# Patient Record
Sex: Male | Born: 1970
Health system: Southern US, Community
[De-identification: ages and names within clinical notes are randomized; demographics above are authoritative.]

## PROBLEM LIST (undated history)

## (undated) DIAGNOSIS — M19072 Primary osteoarthritis, left ankle and foot: Secondary | ICD-10-CM

## (undated) DIAGNOSIS — K810 Acute cholecystitis: Principal | ICD-10-CM

## (undated) DIAGNOSIS — F410 Panic disorder [episodic paroxysmal anxiety] without agoraphobia: Secondary | ICD-10-CM

## (undated) DIAGNOSIS — L0390 Cellulitis, unspecified: Secondary | ICD-10-CM

## (undated) DIAGNOSIS — K219 Gastro-esophageal reflux disease without esophagitis: Secondary | ICD-10-CM

## (undated) DIAGNOSIS — H6980 Other specified disorders of Eustachian tube, unspecified ear: Secondary | ICD-10-CM

## (undated) HISTORY — DX: Cellulitis, unspecified: L03.90

## (undated) HISTORY — DX: Primary osteoarthritis, left ankle and foot: M19.072

## (undated) HISTORY — DX: Panic disorder (episodic paroxysmal anxiety): F41.0

## (undated) HISTORY — DX: Other specified disorders of Eustachian tube, unspecified ear: H69.80

## (undated) HISTORY — DX: Gastro-esophageal reflux disease without esophagitis: K21.9

## (undated) HISTORY — DX: Acute cholecystitis: K81.0

## (undated) HISTORY — PX: HERNIA REPAIR: SHX51

---

## 1999-11-06 ENCOUNTER — Emergency Department (HOSPITAL_COMMUNITY): Admission: EM | Admit: 1999-11-06 | Discharge: 1999-11-07 | Payer: Self-pay | Admitting: Emergency Medicine

## 2004-07-12 ENCOUNTER — Ambulatory Visit: Payer: Self-pay | Admitting: Family Medicine

## 2004-07-23 ENCOUNTER — Ambulatory Visit: Payer: Self-pay | Admitting: Family Medicine

## 2004-08-07 ENCOUNTER — Ambulatory Visit: Payer: Self-pay | Admitting: Family Medicine

## 2005-05-10 ENCOUNTER — Ambulatory Visit: Payer: Self-pay | Admitting: Family Medicine

## 2006-06-17 ENCOUNTER — Ambulatory Visit: Payer: Self-pay | Admitting: Family Medicine

## 2007-10-15 ENCOUNTER — Ambulatory Visit: Payer: Self-pay | Admitting: Family Medicine

## 2007-10-15 DIAGNOSIS — M25519 Pain in unspecified shoulder: Secondary | ICD-10-CM | POA: Insufficient documentation

## 2008-08-24 ENCOUNTER — Ambulatory Visit: Payer: Self-pay | Admitting: Family Medicine

## 2008-08-24 DIAGNOSIS — H6980 Other specified disorders of Eustachian tube, unspecified ear: Secondary | ICD-10-CM

## 2008-08-24 DIAGNOSIS — H6990 Unspecified Eustachian tube disorder, unspecified ear: Secondary | ICD-10-CM

## 2008-08-24 HISTORY — DX: Unspecified eustachian tube disorder, unspecified ear: H69.90

## 2008-08-24 HISTORY — DX: Other specified disorders of Eustachian tube, unspecified ear: H69.80

## 2008-08-31 DIAGNOSIS — F410 Panic disorder [episodic paroxysmal anxiety] without agoraphobia: Secondary | ICD-10-CM | POA: Insufficient documentation

## 2008-09-02 ENCOUNTER — Ambulatory Visit: Payer: Self-pay | Admitting: Family Medicine

## 2008-09-07 LAB — CONVERTED CEMR LAB
ALT: 27 units/L (ref 0–53)
AST: 26 units/L (ref 0–37)
Alkaline Phosphatase: 56 units/L (ref 39–117)
BUN: 13 mg/dL (ref 6–23)
Basophils Absolute: 0 10*3/uL (ref 0.0–0.1)
Bilirubin, Direct: 0.1 mg/dL (ref 0.0–0.3)
Calcium: 9.1 mg/dL (ref 8.4–10.5)
Cholesterol: 145 mg/dL (ref 0–200)
Creatinine, Ser: 1 mg/dL (ref 0.4–1.5)
Eosinophils Relative: 7.1 % — ABNORMAL HIGH (ref 0.0–5.0)
GFR calc non Af Amer: 89.95 mL/min (ref >60)
HCT: 43.6 % (ref 39.0–52.0)
HDL: 46.7 mg/dL (ref >39.00)
LDL Cholesterol: 76 mg/dL (ref 0–99)
Lymphocytes Relative: 27.9 % (ref 12.0–46.0)
Monocytes Relative: 11.1 % (ref 3.0–12.0)
Neutrophils Relative %: 53.7 % (ref 43.0–77.0)
Platelets: 216 10*3/uL (ref 150.0–400.0)
Potassium: 3.9 meq/L (ref 3.5–5.1)
Total Bilirubin: 0.8 mg/dL (ref 0.3–1.2)
VLDL: 22.8 mg/dL (ref 0.0–40.0)
WBC: 6 10*3/uL (ref 4.5–10.5)

## 2008-09-22 ENCOUNTER — Ambulatory Visit: Payer: Self-pay | Admitting: Family Medicine

## 2008-09-22 DIAGNOSIS — IMO0002 Reserved for concepts with insufficient information to code with codable children: Secondary | ICD-10-CM

## 2008-09-22 DIAGNOSIS — M5410 Radiculopathy, site unspecified: Secondary | ICD-10-CM

## 2008-10-10 ENCOUNTER — Ambulatory Visit: Payer: Self-pay | Admitting: Family Medicine

## 2009-08-11 ENCOUNTER — Ambulatory Visit: Payer: Self-pay | Admitting: Internal Medicine

## 2009-12-19 ENCOUNTER — Ambulatory Visit: Payer: Self-pay | Admitting: Family Medicine

## 2009-12-19 DIAGNOSIS — R12 Heartburn: Secondary | ICD-10-CM | POA: Insufficient documentation

## 2010-04-24 NOTE — Assessment & Plan Note (Signed)
Summary: having panic attacks/alc   Vital Signs:  Patient profile:   40 year old male Height:      73 inches Weight:      209.50 pounds BMI:     27.74 Temp:     97.9 degrees F oral Pulse rate:   76 / minute Pulse rhythm:   regular BP sitting:   100 / 64  (left arm) Cuff size:   large  Vitals Entered By: Lewanda Rife LPN (December 19, 2009 2:59 PM) CC: Painc attack and indigestion after eating on 12/18/09. After pt vomited indigestion would go away. Today no problems with panic attack or indigestion.   History of Present Illness: here for f/u of panic attack and bout of indigestion  this was 9/26  used to have panic attacks 9 years ago  yesterday am 9  started having bad heartburn --- and then became numb all over  felt like a panic attack  called paramedics -- and by the time they got there it was gone  indigestion all day long - burp and burning in chest  had eaten spicy chicken the night before  ate cereal for bkfast  was back to nl - ekg fine and bp fine   yesterday vomited times 5 every time he ate  is fine today never happened before  took a prilosec yesterday and threw it up   ?perhaps he had a virus  no diarrhea  no abd pain -- just in the chest   Allergies (verified): No Known Drug Allergies  Past History:  Past Medical History: Last updated: 09/22/2008 n/a  Past Surgical History: Last updated: 08/31/2008 Echo (12/17/1999)  Family History: Last updated: 08/31/2008 Father: healthy Mother: deceased- lung cancer, non smoker Siblings: 2 brothers  Social History: Last updated: 08/24/2008 married  expecting baby in [place] 09--daughter non smoker  occupation--[employer]   Risk Factors: Alcohol Use: <1 (08/24/2008) Caffeine Use: 4 (08/24/2008) Exercise: no (08/24/2008)  Risk Factors: Smoking Status: never (08/24/2008) Packs/Day: chews (08/31/2008)  Review of Systems General:  Denies chills, fatigue, fever, and loss of appetite. Eyes:  Denies  blurring and eye irritation. CV:  Denies chest pain or discomfort, lightheadness, palpitations, and shortness of breath with exertion. Resp:  Denies cough, shortness of breath, and wheezing. GI:  Complains of indigestion, nausea, and vomiting; denies abdominal pain. GU:  Denies dysuria and urinary frequency. MS:  Denies joint pain. Derm:  Denies itching, lesion(s), poor wound healing, and rash. Neuro:  Denies headaches, numbness, visual disturbances, and weakness. Psych:  Complains of anxiety and panic attacks. Endo:  Denies cold intolerance and heat intolerance.  Physical Exam  General:  Well-developed,well-nourished,in no acute distress; alert,appropriate and cooperative throughout examination Head:  normocephalic, atraumatic, and no abnormalities observed.   Eyes:  vision grossly intact, pupils equal, pupils round, pupils reactive to light, and no injection.   Mouth:  pharynx pink and moist.   Neck:  supple with full rom and no masses or thyromegally, no JVD or carotid bruit  Lungs:  Normal respiratory effort, chest expands symmetrically. Lungs are clear to auscultation, no crackles or wheezes. Heart:  Normal rate and regular rhythm. S1 and S2 normal without gallop, murmur, click, rub or other extra sounds. Abdomen:  Bowel sounds positive,abdomen soft and non-tender without masses, organomegaly or hernias noted. Msk:  No deformity or scoliosis noted of thoracic or lumbar spine.   Pulses:  R and L carotid,radial,femoral,dorsalis pedis and posterior tibial pulses are full and equal bilaterally Extremities:  No clubbing,  cyanosis, edema, or deformity noted with normal full range of motion of all joints.   Neurologic:  sensation intact to light touch, gait normal, and DTRs symmetrical and normal.  no tremor  Skin:  Intact without suspicious lesions or rashes Cervical Nodes:  No lymphadenopathy noted Psych:  normal affect, talkative and pleasant    Impression & Recommendations:  Problem  # 1:  Hx of PANIC DISORDER (ICD-300.01) Assessment Deteriorated one panic attack in conjunction with severe indigestion or poss esoph spasm  was resolved quickly nothing to do now - but if they re- occur will update me to disc therapy  no new stress or other factors   Problem # 2:  HEARTBURN (ICD-787.1) Assessment: New 1 day of severe heartburn symptoms and vomiting (without abd pain ) after eating spicy chicken disc poss of viral or food rxn will watch carefully if symptoms re- occur will disc tx  adv to avoid spice/ caff this week  ems work up with neg ekg (per pt) is re- assuring   Patient Instructions: 1)  if panic attacks come back- please update me  2)  if indigestion comes back - also update me  3)  avoid spicy food and caffiene and alcohol this week 4)  if abdominal pain/ vomiting / fever or other symptoms - please call   Current Allergies (reviewed today): No known allergies

## 2010-06-12 ENCOUNTER — Ambulatory Visit: Payer: Self-pay | Admitting: Family Medicine

## 2010-06-27 ENCOUNTER — Ambulatory Visit: Payer: Self-pay | Admitting: Family Medicine

## 2010-07-03 ENCOUNTER — Encounter: Payer: Self-pay | Admitting: Family Medicine

## 2010-07-09 ENCOUNTER — Ambulatory Visit: Payer: Self-pay | Admitting: Family Medicine

## 2011-10-30 ENCOUNTER — Ambulatory Visit (INDEPENDENT_AMBULATORY_CARE_PROVIDER_SITE_OTHER)
Admission: RE | Admit: 2011-10-30 | Discharge: 2011-10-30 | Disposition: A | Discharge status: Home / Self Care | Payer: 59 | Source: Ambulatory Visit | Attending: Family Medicine | Admitting: Family Medicine

## 2011-10-30 ENCOUNTER — Encounter: Payer: Self-pay | Admitting: Family Medicine

## 2011-10-30 ENCOUNTER — Ambulatory Visit (INDEPENDENT_AMBULATORY_CARE_PROVIDER_SITE_OTHER): Payer: 59 | Admitting: Family Medicine

## 2011-10-30 VITALS — BP 122/79 | HR 70 | Temp 97.5°F | Ht 73.0 in | Wt 209.0 lb

## 2011-10-30 DIAGNOSIS — Z720 Tobacco use: Secondary | ICD-10-CM | POA: Insufficient documentation

## 2011-10-30 DIAGNOSIS — M25571 Pain in right ankle and joints of right foot: Secondary | ICD-10-CM | POA: Insufficient documentation

## 2011-10-30 DIAGNOSIS — F17200 Nicotine dependence, unspecified, uncomplicated: Secondary | ICD-10-CM

## 2011-10-30 DIAGNOSIS — M25579 Pain in unspecified ankle and joints of unspecified foot: Secondary | ICD-10-CM

## 2011-10-30 MED ORDER — VARENICLINE TARTRATE 0.5 MG X 11 & 1 MG X 42 PO MISC: ORAL | Status: DC

## 2011-10-30 MED ORDER — VARENICLINE TARTRATE 1 MG PO TABS: 1.0000 mg | ORAL_TABLET | Freq: Two times a day (BID) | ORAL | Status: AC

## 2011-10-30 MED ORDER — MELOXICAM 15 MG PO TABS: 15.0000 mg | ORAL_TABLET | Freq: Every day | ORAL | Status: DC

## 2011-10-30 NOTE — Patient Instructions (Addendum)
Try chantix to quit tab Take starter pack and then get the mt dose 1 mg twice daily  If intolerable side effects or mood changes - stop it and let us know  For ankle - use intermittent ice and heat 10 minutes at a time  Use a heel cup or lift in shoe to relax achilles  Stop ibuprofen Take mobic 15 mg once daily with food for 2 weeks  Xray today - will call with result

## 2011-10-30 NOTE — Assessment & Plan Note (Signed)
Suspect achilles strain/ tendonitis  Xray today - to see if other problems or calcium deposit Ice /heat Will use heel cup or rise in shoe to relax achilles Use caution on ladders mobic 15 mg once daily  Update if not starting to improve in a week or if worsening

## 2011-10-30 NOTE — Assessment & Plan Note (Signed)
Pt is intent on quitting  Will try chantix -sent to pharmacy starter pack then 1 mg bid as tolerated Disc quitting process Disc poss side eff in detail  Will update if any problems or mood changes

## 2011-10-30 NOTE — Progress Notes (Signed)
  Subjective:    Patient ID: Austin Nguyen, male    DOB: 1971-01-20, 41 y.o.   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 PANIC DISORDER  . EUSTACHIAN TUBE DYSFUNCTION, LEFT  . SHOULDER PAIN  . HERNIATED DISC  . BACK PAIN, LUMBAR, WITH RADICULOPATHY  . HEARTBURN   No past medical history on file. No past surgical history on file. History  Substance Use Topics  . Smoking status: Never Smoker   . Smokeless tobacco: Not on file  . Alcohol Use: Yes   Family History  Problem Relation Age of Onset  . Cancer Mother     lung cancer non smoker   No Known Allergies No current outpatient prescriptions on file prior to visit.      Review of Systems Review of Systems  Constitutional: Negative for fever, appetite change, fatigue and unexpected weight change.  Eyes: Negative for pain and visual disturbance.  ENT neg for mouth pain/ sores or lesions Respiratory: Negative for cough and shortness of breath.   Cardiovascular: Negative for cp or palpitations    Gastrointestinal: Negative for nausea, diarrhea and constipation.  Genitourinary: Negative for urgency and frequency.  Skin: Negative for pallor or rash   MSK pos for R ankle pain Neurological: Negative for weakness, light-headedness, numbness and headaches.    Hematological: Negative for adenopathy. Does not bruise/bleed easily.  Psychiatric/Behavioral: Negative for dysphoric mood. The patient is not nervous/anxious.         Objective:   Physical Exam  Constitutional: He appears well-developed and well-nourished. No distress.  HENT:  Head: Normocephalic and atraumatic.  Mouth/Throat: Oropharynx is clear and moist. No oropharyngeal exudate.       Nl mouth and throat exam  Eyes: Conjunctivae and EOM are normal. Pupils are equal, round, and reactive to light. Right eye exhibits no discharge. Left eye exhibits no discharge.  Neck: Normal range of motion. Neck supple. No JVD present. No thyromegaly present.  Cardiovascular: Normal rate, regular rhythm and intact distal pulses.   Pulmonary/Chest: Effort normal and breath sounds normal. No respiratory distress. He has no wheezes.  Musculoskeletal: He exhibits tenderness. He exhibits no edema.       Tender over R achilles and post to r lat malleolus Pain to dorsiflex foot Nl rom otherwise  Nl gait No swelling or bruising   Lymphadenopathy:    He has no cervical adenopathy.  Neurological: He is alert. He has normal reflexes.  Skin: Skin is warm and dry. No rash noted. No erythema. No pallor.  Psychiatric: He has a normal mood and affect.          Assessment & Plan:

## 2012-04-21 ENCOUNTER — Ambulatory Visit (INDEPENDENT_AMBULATORY_CARE_PROVIDER_SITE_OTHER): Payer: BC Managed Care – PPO | Admitting: Family Medicine

## 2012-04-21 ENCOUNTER — Encounter: Payer: Self-pay | Admitting: Family Medicine

## 2012-04-21 VITALS — BP 102/68 | HR 58 | Temp 97.7°F | Ht 73.0 in | Wt 212.0 lb

## 2012-04-21 DIAGNOSIS — M545 Low back pain, unspecified: Secondary | ICD-10-CM

## 2012-04-21 DIAGNOSIS — S335XXA Sprain of ligaments of lumbar spine, initial encounter: Secondary | ICD-10-CM

## 2012-04-21 DIAGNOSIS — S39012A Strain of muscle, fascia and tendon of lower back, initial encounter: Secondary | ICD-10-CM

## 2012-04-21 LAB — POCT URINALYSIS DIPSTICK
Bilirubin, UA: NEGATIVE
Blood, UA: NEGATIVE
Glucose, UA: NEGATIVE
Ketones, UA: NEGATIVE
Leukocytes, UA: NEGATIVE
pH, UA: 6

## 2012-04-21 MED ORDER — MELOXICAM 15 MG PO TABS: 15.0000 mg | ORAL_TABLET | Freq: Every day | ORAL | Status: DC

## 2012-04-21 MED ORDER — CYCLOBENZAPRINE HCL 10 MG PO TABS: 10.0000 mg | ORAL_TABLET | Freq: Three times a day (TID) | ORAL | Status: DC | PRN

## 2012-04-21 NOTE — Progress Notes (Signed)
Subjective:    Patient ID: Austin Nguyen, male    DOB: Aug 04, 1970, 42 y.o.   MRN: [id number]  HPI Here for low back pain  This is a chronic problem as well  Got worse wed of last week- got up and twisted and then had immediate pain  Now back is "locked up" and hard to move - esp after sitting  Hurts down his right leg  He has had back problems in past -never lasted this long (low back right leg)  Never had any imaging studies   No medicine other than some aleve as needed  Not on mobic currently (was on that for achilles )  No numbness or weakness in foot or leg  Has never done any PT for this   Patient Active Problem List  Diagnosis  . PANIC DISORDER  . EUSTACHIAN TUBE DYSFUNCTION, LEFT  . SHOULDER PAIN  . HERNIATED DISC  . BACK PAIN, LUMBAR, WITH RADICULOPATHY  . HEARTBURN  . Right ankle pain  . Chewing tobacco use   No past medical history on file. No past surgical history on file. History  Substance Use Topics  . Smoking status: Never Smoker   . Smokeless tobacco: Not on file  . Alcohol Use: Yes     Comment: occ   Family History  Problem Relation Age of Onset  . Cancer Mother     lung cancer non smoker   No Known Allergies Current Outpatient Prescriptions on File Prior to Visit  Medication Sig Dispense Refill  . meloxicam (MOBIC) 15 MG tablet Take 1 tablet (15 mg total) by mouth daily. With food  30 tablet  0  . varenicline (CHANTIX STARTING MONTH PAK) 0.5 MG X 11 & 1 MG X 42 tablet Take by mouth as directed  53 tablet  0        Review of Systems Review of Systems  Constitutional: Negative for fever, appetite change, fatigue and unexpected weight change.  Eyes: Negative for pain and visual disturbance.  Respiratory: Negative for cough and shortness of breath.   Cardiovascular: Negative for cp or palpitations    Gastrointestinal: Negative for nausea, diarrhea and constipation.  Genitourinary: Negative for urgency and frequency.  Skin: Negative for  pallor or rash   MSK pos for back pain lower with rad to leg Neurological: Negative for weakness, light-headedness, numbness and headaches.  Hematological: Negative for adenopathy. Does not bruise/bleed easily.  Psychiatric/Behavioral: Negative for dysphoric mood. The patient is not nervous/anxious.         Objective:   Physical Exam  Constitutional: He appears well-developed and well-nourished. No distress.  HENT:  Head: Normocephalic and atraumatic.  Mouth/Throat: Oropharynx is clear and moist.  Eyes: Conjunctivae normal and EOM are normal. Pupils are equal, round, and reactive to light.  Neck: Normal range of motion. Neck supple. No thyromegaly present.  Cardiovascular: Normal rate.   Pulmonary/Chest: Effort normal and breath sounds normal. No respiratory distress.  Abdominal: Soft.  Musculoskeletal: He exhibits tenderness. He exhibits no edema.       Lumbar back: He exhibits decreased range of motion, tenderness, pain and spasm. He exhibits no bony tenderness, no swelling, no edema and no deformity.       R para cervical spasm and tenderness (muscle) SLR raise yields back but not leg pain  Pain to int rot R hip  LS flex 30 deg/ ext none/ R flex pain Slow gait    Lymphadenopathy:    He has no cervical adenopathy.  Neurological: He is alert. He has normal reflexes. He displays no atrophy. No sensory deficit. He exhibits normal muscle tone. Coordination normal.  Skin: Skin is warm and dry. No rash noted.  Psychiatric: He has a normal mood and affect.          Assessment & Plan:

## 2012-04-21 NOTE — Patient Instructions (Addendum)
Use heat on your back  mobic daily with food  Flexeril with caution- muscle relaxer can sedate Avoid heavy lifting but keep walking  We will do PT referral at check out

## 2012-04-21 NOTE — Assessment & Plan Note (Signed)
This seems muscular with no neurol s/s except radiation of pain to leg  Reassuring exam  Given handout  tx with mobic and flexeril with warnings  Adv heat intermittent and walking  Ref to PT eval and tx  If no imp- consider films

## 2012-04-23 ENCOUNTER — Encounter: Payer: Self-pay | Admitting: Family Medicine

## 2012-04-25 ENCOUNTER — Encounter: Payer: Self-pay | Admitting: Family Medicine

## 2012-05-23 ENCOUNTER — Encounter: Payer: Self-pay | Admitting: Family Medicine

## 2012-06-27 ENCOUNTER — Other Ambulatory Visit: Payer: Self-pay | Admitting: Family Medicine

## 2012-06-29 NOTE — Telephone Encounter (Signed)
Can have 3 mo of refils, thanks

## 2012-06-29 NOTE — Telephone Encounter (Signed)
done

## 2012-06-29 NOTE — Telephone Encounter (Signed)
Ok to refill 

## 2012-09-04 ENCOUNTER — Ambulatory Visit
Admission: RE | Admit: 2012-09-04 | Discharge: 2012-09-04 | Disposition: A | Discharge status: Home / Self Care | Payer: BC Managed Care – PPO | Source: Ambulatory Visit | Attending: Family Medicine | Admitting: Family Medicine

## 2012-09-04 ENCOUNTER — Encounter: Payer: Self-pay | Admitting: Family Medicine

## 2012-09-04 ENCOUNTER — Ambulatory Visit (INDEPENDENT_AMBULATORY_CARE_PROVIDER_SITE_OTHER)
Admission: RE | Admit: 2012-09-04 | Discharge: 2012-09-04 | Disposition: A | Discharge status: Home / Self Care | Payer: BC Managed Care – PPO | Source: Ambulatory Visit | Attending: Family Medicine | Admitting: Family Medicine

## 2012-09-04 ENCOUNTER — Ambulatory Visit (INDEPENDENT_AMBULATORY_CARE_PROVIDER_SITE_OTHER): Payer: BC Managed Care – PPO | Admitting: Family Medicine

## 2012-09-04 VITALS — BP 130/70 | HR 65 | Temp 97.6°F | Ht 73.0 in | Wt 216.0 lb

## 2012-09-04 DIAGNOSIS — M79672 Pain in left foot: Secondary | ICD-10-CM

## 2012-09-04 DIAGNOSIS — M25572 Pain in left ankle and joints of left foot: Secondary | ICD-10-CM

## 2012-09-04 DIAGNOSIS — M19079 Primary osteoarthritis, unspecified ankle and foot: Secondary | ICD-10-CM

## 2012-09-04 DIAGNOSIS — M25579 Pain in unspecified ankle and joints of unspecified foot: Secondary | ICD-10-CM

## 2012-09-04 DIAGNOSIS — M19072 Primary osteoarthritis, left ankle and foot: Secondary | ICD-10-CM

## 2012-09-04 DIAGNOSIS — M25562 Pain in left knee: Secondary | ICD-10-CM

## 2012-09-04 DIAGNOSIS — H53149 Visual discomfort, unspecified: Secondary | ICD-10-CM

## 2012-09-04 DIAGNOSIS — M25569 Pain in unspecified knee: Secondary | ICD-10-CM

## 2012-09-04 DIAGNOSIS — M79609 Pain in unspecified limb: Secondary | ICD-10-CM

## 2012-09-04 NOTE — Patient Instructions (Addendum)
F/u 1 month with Dr. Miley Lindon 

## 2012-09-04 NOTE — Progress Notes (Signed)
Nature conservation officer at [facility] [address] [place] [address] Phone: [phone] Fax: [phone]  Date:  09/04/2012   Name:  Austin Nguyen   DOB:  04-25-1970   MRN:  [id number] Gender: male Age: 42 y.o.  Primary Physician:  Roxy Manns, MD  Evaluating MD: Hannah Beat, MD   Chief Complaint: Ankle Pain and Knee Pain   History of Present Illness:  Austin Nguyen is a 42 y.o. pleasant patient who presents with the following:  Got up off of day bed, ankle rolled over and knee gave way. Both on the left side. Last night - around 6 PM. Minimal weight bearing. Now ankle and foot are severely swollen on medial and lateral aspects, swollen across top of foot and ankle.   Does have a history of multiple sprains, but no known fractures, surgery or other injuries to foot or ankle.   Knee pain anteriorly but to a lesser degree compared to the foot and ankle.   Patient Active Problem List   Diagnosis Date Noted  . Lumbar strain 04/21/2012  . Right ankle pain 10/30/2011  . Chewing tobacco use 10/30/2011  . HEARTBURN 12/19/2009  . BACK PAIN, LUMBAR, WITH RADICULOPATHY 09/22/2008  . PANIC DISORDER 08/31/2008  . EUSTACHIAN TUBE DYSFUNCTION, LEFT 08/24/2008  . SHOULDER PAIN 10/15/2007    No past medical history on file.  No past surgical history on file.  History   Social History  . Marital Status: Married    Spouse Name: N/A    Number of Children: N/A  . Years of Education: N/A   Occupational History  . Not on file.   Social History Main Topics  . Smoking status: Never Smoker   . Smokeless tobacco: Not on file  . Alcohol Use: Yes     Comment: occ  . Drug Use: No  . Sexually Active: Not on file   Other Topics Concern  . Not on file   Social History Narrative  . No narrative on file    Family History  Problem Relation Age of Onset  . Cancer Mother     lung cancer non smoker    No Known Allergies  Medication list has been reviewed and  updated.  Outpatient Prescriptions Prior to Visit  Medication Sig Dispense Refill  . cyclobenzaprine (FLEXERIL) 10 MG tablet Take 1 tablet (10 mg total) by mouth 3 (three) times daily as needed for muscle spasms.  30 tablet  0  . meloxicam (MOBIC) 15 MG tablet TAKE 1 TABLET (15 MG TOTAL) BY MOUTH DAILY. WITH FOOD AS NEEDED FOR BACK PAIN  30 tablet  2  . varenicline (CHANTIX STARTING MONTH PAK) 0.5 MG X 11 & 1 MG X 42 tablet Take by mouth as directed  53 tablet  0   No facility-administered medications prior to visit.    Review of Systems:   GEN: No fevers, chills. Nontoxic. Primarily MSK c/o today. MSK: Detailed in the HPI GI: tolerating PO intake without difficulty Neuro: No numbness, parasthesias, or tingling associated. Otherwise the pertinent positives of the ROS are noted above.    Physical Examination: BP 130/70  Pulse 65  Temp(Src) 97.6 F (36.4 C) (Oral)  Ht 6\' 1"  (1.854 m)  Wt 216 lb (97.977 kg)  BMI 28.5 kg/m2  SpO2 97%  Ideal Body Weight: Weight in (lb) to have BMI = 25: 189.1   GEN: WDWN, NAD, Non-toxic, Alert & Oriented x 3 HEENT: Atraumatic, Normocephalic.  Ears and Nose:  No external deformity. PSYCH: Normally interactive. Conversant. Not depressed or anxious appearing.  Calm demeanor.   Knee:  l ROM: 0-130 Effusion: neg Echymosis or edema: mild swelling medial to tibial tubercle. Patellar tendon NT Painful PLICA: neg Patellar grind: negative Medial and lateral patellar facet loading: negative medial and lateral joint lines:NT Mcmurray's neg Flexion-pinch neg Varus and valgus stress: stable Lachman: neg Ant and Post drawer: neg Hip abduction, IR, ER: WNL Hip flexion str: 5/5 Hip abd: 5/5 Quad: 5/5 VMO atrophy:No Hamstring concentric and eccentric: 5/5   L ankle: NT all toes, all MT. NT 5th MT. NT navicular and cuboid. NT calcaneous. Diffusely swollen and tender along the talus. Lateral mall ttp, ATFL and CFL ttp. NT deltoid lig. Medial mall  nt. Ant drawer tender.  Dg Ankle Complete Left  09/04/2012   *RADIOLOGY REPORT*  Clinical Data: Lateral ankle pain  LEFT ANKLE COMPLETE - 3+ VIEW  Comparison: None.  Findings: No evidence of acute fracture or dislocation.  Moderate degenerative changes at the tibiotalar joint. Multiple well corticated osseous densities, including overlying the lateral malleolus, not acute.  Mild irregularity of the medial talar dome on the mortise view.  These findings suggest post-traumatic degenerative changes, possibly with a prior osteochondral lesion along the medial talar dome.  Moderate soft tissue swelling overlying the lateral malleolus.  IMPRESSION: No evidence of acute fracture or dislocation.  Suspected post-traumatic deformity at the tibiotalar joint with moderate degenerative changes, as described above.  Moderate soft tissue swelling overlying the lateral malleolus.  These results were called by telephone on 09/04/2012 at 1020 hours to [facility], who verbally acknowledged these results.   Original Report Authenticated By: Charline Bills, M.D.   Dg Knee Complete 4 Views Left  09/04/2012   *RADIOLOGY REPORT*  Clinical Data: Left knee pain/trauma  LEFT KNEE - COMPLETE 4+ VIEW  Comparison: None.  Findings: No fracture or dislocation is seen.  The joint spaces are preserved.  The visualized soft tissues are unremarkable.  No suprapatellar knee joint effusion.  IMPRESSION: No fracture or dislocation is seen.   Original Report Authenticated By: Charline Bills, M.D.   Dg Foot Complete Left  09/04/2012   *RADIOLOGY REPORT*  Clinical Data: Trauma, foot/ankle pain  LEFT FOOT - COMPLETE 3+ VIEW  Comparison: None.  Findings: No fracture or dislocation is seen.  Moderate degenerative changes of the tibiotalar joint.  Visualized soft tissues are grossly unremarkable.  IMPRESSION: No fracture or dislocation is seen.   Original Report Authenticated By: Charline Bills, M.D.    Assessment and Plan:  Left foot  pain - Plan: DG Foot Complete Left  Left ankle pain - Plan: DG Ankle Complete Left  Left knee pain - Plan: DG Knee Complete 4 Views Left  Ankle sprain and strain, left, initial encounter  Arthritis of ankle or foot, degenerative, left  I called and discussed the case with Dr. Rito Ehrlich, and we felt these bony changes all represented old injuries from prior trauma, fractures, or potentially an old OCD.   Now with acute severe ankle sprain. Knee of lesser import and probably all contusion.  Immobilize in a full-length cam walker pneumatic boot for 1 month, crutches and minimal weight bearing for 2 weeks, f/u with me in 4 weeks.  Orders Today:  Orders Placed This Encounter  Procedures  . DG Foot Complete Left    Standing Status: Future     Number of Occurrences: 1     Standing Expiration Date: 11/04/2013    Order  Specific Question:  Preferred imaging location?    Answer:  [facility]    Order Specific Question:  Reason for exam:    Answer:  trauma yesterday, foot and ankle pain  . DG Knee Complete 4 Views Left    Standing Status: Future     Number of Occurrences: 1     Standing Expiration Date: 11/04/2013    Order Specific Question:  Reason for exam:    Answer:  trauma L knee, most concern around prox tibia and medial joint line    Order Specific Question:  Preferred imaging location?    Answer:  [facility]  . DG Ankle Complete Left    Standing Status: Future     Number of Occurrences: 1     Standing Expiration Date: 11/04/2013    Order Specific Question:  Preferred imaging location?    Answer:  [facility]    Order Specific Question:  Reason for exam:    Answer:  ankle pain laterally, pain at talus    Updated Medication List: (Includes new medications, updates to list, dose adjustments) No orders of the defined types were placed in this encounter.    Medications Discontinued: Medications Discontinued During This Encounter  Medication Reason  .  varenicline (CHANTIX STARTING MONTH PAK) 0.5 MG X 11 & 1 MG X 42 tablet Error  . meloxicam (MOBIC) 15 MG tablet Error  . cyclobenzaprine (FLEXERIL) 10 MG tablet Error      Signed, Valeriano Bain T. Chrisanne Loose, MD 09/04/2012 9:21 AM

## 2012-09-05 DIAGNOSIS — M19079 Primary osteoarthritis, unspecified ankle and foot: Secondary | ICD-10-CM | POA: Insufficient documentation

## 2012-09-21 ENCOUNTER — Ambulatory Visit (INDEPENDENT_AMBULATORY_CARE_PROVIDER_SITE_OTHER): Payer: BC Managed Care – PPO | Admitting: Family Medicine

## 2012-09-21 ENCOUNTER — Encounter: Payer: Self-pay | Admitting: Family Medicine

## 2012-09-21 VITALS — BP 106/70 | HR 95 | Temp 98.2°F | Ht 73.0 in | Wt 220.8 lb

## 2012-09-21 DIAGNOSIS — F410 Panic disorder [episodic paroxysmal anxiety] without agoraphobia: Secondary | ICD-10-CM

## 2012-09-21 HISTORY — DX: Panic disorder (episodic paroxysmal anxiety): F41.0

## 2012-09-21 MED ORDER — ALPRAZOLAM 0.5 MG PO TABS: 0.5000 mg | ORAL_TABLET | Freq: Every evening | ORAL | Status: DC | PRN

## 2012-09-21 MED ORDER — BUSPIRONE HCL 15 MG PO TABS: 7.5000 mg | ORAL_TABLET | Freq: Two times a day (BID) | ORAL | Status: DC

## 2012-09-21 NOTE — Progress Notes (Signed)
Subjective:    Patient ID: Austin Nguyen, male    DOB: 02-Jul-1970, 42 y.o.   MRN: [id number]  HPI Here for anxiety  Has hx of panic disorder years ago   No idea what triggers them  No more stress than usual   In the past he was given medication- something similar to xanax   Given paxil - gave him some depression symptoms with that -not on it very long   Has panic attacks lately- 3 times per week lasting 20-60 minutes  Symptoms- feels dizzy/ numbness of hands and face/ rapid breathing and feeling of panic/ impending doom  Not assoc with eating or specific times of day  In the past -long car trips caused them (not now)   No generalized anxiety or depressive symptoms  Has a sprained ankle -in a boot now -- that is getting better  Usually plays basketball   Caffeine intake 2 sodas per day- has cut down  Patient Active Problem List   Diagnosis Date Noted  . Arthritis of ankle or foot, degenerative 09/05/2012  . Lumbar strain 04/21/2012  . Right ankle pain 10/30/2011  . Chewing tobacco use 10/30/2011  . HEARTBURN 12/19/2009  . BACK PAIN, LUMBAR, WITH RADICULOPATHY 09/22/2008  . PANIC DISORDER 08/31/2008  . EUSTACHIAN TUBE DYSFUNCTION, LEFT 08/24/2008  . SHOULDER PAIN 10/15/2007   No past medical history on file. No past surgical history on file. History  Substance Use Topics  . Smoking status: Never Smoker   . Smokeless tobacco: Not on file  . Alcohol Use: Yes     Comment: occ   Family History  Problem Relation Age of Onset  . Cancer Mother     lung cancer non smoker   No Known Allergies No current outpatient prescriptions on file prior to visit.   No current facility-administered medications on file prior to visit.      Review of Systems    Review of Systems  Constitutional: Negative for fever, appetite change, fatigue and unexpected weight change.  Eyes: Negative for pain and visual disturbance.  Respiratory: Negative for cough and shortness of breath.    Cardiovascular: Negative for cp or palpitations    Gastrointestinal: Negative for nausea, diarrhea and constipation.  Genitourinary: Negative for urgency and frequency.  Skin: Negative for pallor or rash   Neurological: Negative for weakness, light-headedness, numbness and headaches.  Hematological: Negative for adenopathy. Does not bruise/bleed easily.  Psychiatric/Behavioral: Negative for dysphoric mood. The patient is anxious and has panic symptoms.      Objective:   Physical Exam  Constitutional: He appears well-developed and well-nourished. No distress.  HENT:  Head: Normocephalic and atraumatic.  Mouth/Throat: Oropharynx is clear and moist.  Eyes: Conjunctivae and EOM are normal. Pupils are equal, round, and reactive to light.  Neck: Normal range of motion. Neck supple. No JVD present. No thyromegaly present.  Cardiovascular: Normal rate, regular rhythm, normal heart sounds and intact distal pulses.   No murmur heard. Pulmonary/Chest: Effort normal and breath sounds normal. No respiratory distress. He has no wheezes.  Musculoskeletal: He exhibits no edema.  Lymphadenopathy:    He has no cervical adenopathy.  Neurological: He is alert. He has normal reflexes. He displays no tremor. No cranial nerve deficit. He exhibits normal muscle tone. Coordination normal.  Skin: Skin is warm and dry. No rash noted. No erythema. No pallor.  Psychiatric: He has a normal mood and affect. His speech is normal and behavior is normal. His mood appears not anxious.  His affect is not blunt and not labile. Thought content is not paranoid. Cognition and memory are normal. He does not exhibit a depressed mood. He expresses no homicidal and no suicidal ideation.  Not overtly anxious during interview          Assessment & Plan:

## 2012-09-21 NOTE — Patient Instructions (Addendum)
For panic disorder start buspar 1/2 pill twice daily- if side effects such as mood change-stop med and let me know  Have xanax for "emergencies"- a panic attack that will not stop  Stay as active as you can  Avoid caffeine Follow up with me in about 6-8 weeks

## 2012-09-21 NOTE — Assessment & Plan Note (Signed)
Intermittent in the past and worse lately No success with paxil in the past- caused mood disorder Will try buspar 7.5 bid -disc poss side eff in detail  Also xanax 10 pills - to use for emergencies or panic attacks lasting over 30 min (cautioned about habit forming pot and sedation) F/u planned Disc stressors and coping tech >25 min spent with face to face with patient, >50% counseling and/or coordinating care

## 2012-11-16 ENCOUNTER — Ambulatory Visit: Payer: BC Managed Care – PPO | Admitting: Family Medicine

## 2012-11-16 DIAGNOSIS — Z0289 Encounter for other administrative examinations: Secondary | ICD-10-CM

## 2013-01-04 ENCOUNTER — Other Ambulatory Visit: Payer: Self-pay | Admitting: *Deleted

## 2013-01-04 NOTE — Telephone Encounter (Signed)
Requesting 90 day supply, pt was a no show to his 8 week f/u and has not rescheduled appt.

## 2013-01-04 NOTE — Telephone Encounter (Signed)
Please re schedule that and refill until then

## 2013-01-06 ENCOUNTER — Encounter: Payer: Self-pay | Admitting: *Deleted

## 2013-01-06 NOTE — Telephone Encounter (Signed)
Tried to call pt yesterday and today and I can't connect to pt's #, as soon as I dial # the phone disconnects my call.  Letter mailed to pt letting him know he needs a f/u

## 2013-01-14 NOTE — Telephone Encounter (Signed)
Pt never responded to my letter and I cant' call him so declined med and advise pharmacy pt need to schedule f/u appt

## 2013-02-24 ENCOUNTER — Telehealth: Payer: Self-pay

## 2013-02-24 DIAGNOSIS — Z0279 Encounter for issue of other medical certificate: Secondary | ICD-10-CM

## 2013-02-24 NOTE — Telephone Encounter (Signed)
Pt dropped off [employer] Forms for completion.  Best number to reach pt is [phone] / lt

## 2013-02-24 NOTE — Telephone Encounter (Signed)
Pt has form from [place] ins with questions about last panic or anxiety attack; pt will bring form to office today for Dr Milinda Antis to review and complete.

## 2013-02-24 NOTE — Telephone Encounter (Signed)
In IN box Please ask pt about question 9 Thanks

## 2013-03-01 ENCOUNTER — Other Ambulatory Visit: Payer: Self-pay

## 2013-03-02 NOTE — Telephone Encounter (Signed)
Pt advise of the $20 fee and answered the question #9, form faxed

## 2014-04-08 ENCOUNTER — Encounter: Payer: Self-pay | Admitting: Family Medicine

## 2014-04-08 ENCOUNTER — Ambulatory Visit (INDEPENDENT_AMBULATORY_CARE_PROVIDER_SITE_OTHER): Payer: BLUE CROSS/BLUE SHIELD | Admitting: Family Medicine

## 2014-04-08 VITALS — BP 124/64 | HR 72 | Temp 98.0°F | Wt 211.0 lb

## 2014-04-08 DIAGNOSIS — M5410 Radiculopathy, site unspecified: Secondary | ICD-10-CM

## 2014-04-08 MED ORDER — CYCLOBENZAPRINE HCL 10 MG PO TABS: 10.0000 mg | ORAL_TABLET | Freq: Three times a day (TID) | ORAL | Status: DC | PRN

## 2014-04-08 MED ORDER — MELOXICAM 15 MG PO TABS: ORAL_TABLET | ORAL | Status: DC

## 2014-04-08 NOTE — Progress Notes (Signed)
Pre visit review using our clinic review tool, if applicable. No additional management support is needed unless otherwise documented below in the visit note. 

## 2014-04-08 NOTE — Progress Notes (Signed)
Subjective:    Patient ID: Austin Nguyen, male    DOB: December 22, 1970, 44 y.o.   MRN: [id number]  HPI Woke up Monday with low back pain on L  Very stiff and hard to move  He has started an exercise program with pilates/yoga/cardio  He fell 2 weeks prior on R side - that did not bother him   Pain is sharp at times/otherwise dull  No pain to lie down  Worse when up  Walking - is 6/10 on pain scale  Bending and twisting is the worst  Is rad to L leg    This has happened before - went to PT  He did start his stretches again on Wednesday   Taking ibuprofen 400 mg every 4 hours as needed Using heat   Patient Active Problem List   Diagnosis Date Noted  . Panic disorder 09/21/2012  . Arthritis of ankle or foot, degenerative 09/05/2012  . Lumbar strain 04/21/2012  . Right ankle pain 10/30/2011  . Chewing tobacco use 10/30/2011  . HEARTBURN 12/19/2009  . BACK PAIN, LUMBAR, WITH RADICULOPATHY 09/22/2008  . PANIC DISORDER 08/31/2008  . EUSTACHIAN TUBE DYSFUNCTION, LEFT 08/24/2008  . SHOULDER PAIN 10/15/2007   No past medical history on file. No past surgical history on file. History  Substance Use Topics  . Smoking status: Never Smoker   . Smokeless tobacco: Not on file  . Alcohol Use: 0.0 oz/week    0 Not specified per week     Comment: occ   Family History  Problem Relation Age of Onset  . Cancer Mother     lung cancer non smoker   No Known Allergies Current Outpatient Prescriptions on File Prior to Visit  Medication Sig Dispense Refill  . ALPRAZolam (XANAX) 0.5 MG tablet Take 1 tablet (0.5 mg total) by mouth at bedtime as needed for sleep. (Patient not taking: Reported on 04/08/2014) 10 tablet 0  . busPIRone (BUSPAR) 15 MG tablet Take 0.5 tablets (7.5 mg total) by mouth 2 (two) times daily. (Patient not taking: Reported on 04/08/2014) 30 tablet 5   No current facility-administered medications on file prior to visit.      Review of Systems Review of Systems    Constitutional: Negative for fever, appetite change, fatigue and unexpected weight change.  Eyes: Negative for pain and visual disturbance.  Respiratory: Negative for cough and shortness of breath.   Cardiovascular: Negative for cp or palpitations    Gastrointestinal: Negative for nausea, diarrhea and constipation.  Genitourinary: Negative for urgency and frequency.  Skin: Negative for pallor or rash   MSK pos for L sided low back pain radiating to his L leg  Neurological: Negative for weakness, light-headedness, numbness and headaches.  Hematological: Negative for adenopathy. Does not bruise/bleed easily.  Psychiatric/Behavioral: Negative for dysphoric mood. The patient is not nervous/anxious.         Objective:   Physical Exam  Constitutional: He appears well-developed and well-nourished. No distress.  HENT:  Head: Normocephalic and atraumatic.  Neck: Normal range of motion. Neck supple.  Cardiovascular: Normal rate and regular rhythm.   Musculoskeletal: He exhibits tenderness. He exhibits no edema.       Lumbar back: He exhibits decreased range of motion, tenderness and spasm. He exhibits no bony tenderness and no edema.  Tight/spasm in L lumbar musculature  No piriformis findings  Nl rom of hips  LS flex 90 deg/ ext 10 deg     Neurological: He is alert. He has normal  reflexes. He displays no atrophy. No sensory deficit. He exhibits normal muscle tone. Coordination and gait normal.  Skin: Skin is warm and dry. No rash noted. No erythema. No pallor.  Psychiatric: He has a normal mood and affect.          Assessment & Plan:   Problem List Items Addressed This Visit      Nervous and Auditory   Back pain with left-sided radiculopathy - Primary    Recurrent  Improved after PT in the past  inst to start back with PT exercises  meloxicam 15 mg with food qd prn  Flexeril prn with caution of sedation   Update if not starting to improve in a week or if worsening         Relevant Medications   meloxicam (MOBIC) tablet   cyclobenzaprine (FLEXERIL) tablet

## 2014-04-08 NOTE — Patient Instructions (Signed)
Do your PT stretches/exercises as tolerated  Take meloxicam instead of ibuprofen  Also take flexeril with caution of sedation  Use heat  Update if not starting to improve in a week or if worsening

## 2014-04-10 NOTE — Assessment & Plan Note (Signed)
Recurrent  Improved after PT in the past  inst to start back with PT exercises  meloxicam 15 mg with food qd prn  Flexeril prn with caution of sedation   Update if not starting to improve in a week or if worsening

## 2014-06-26 IMAGING — CR DG ANKLE COMPLETE 3+V*R*
3 series · 3 of 3 positions shown · non-contrast
Comparison: No priors.

CLINICAL DATA: Ankle pain.

RIGHT ANKLE - COMPLETE 3+ VIEW

[view not recorded (1 of 3)]
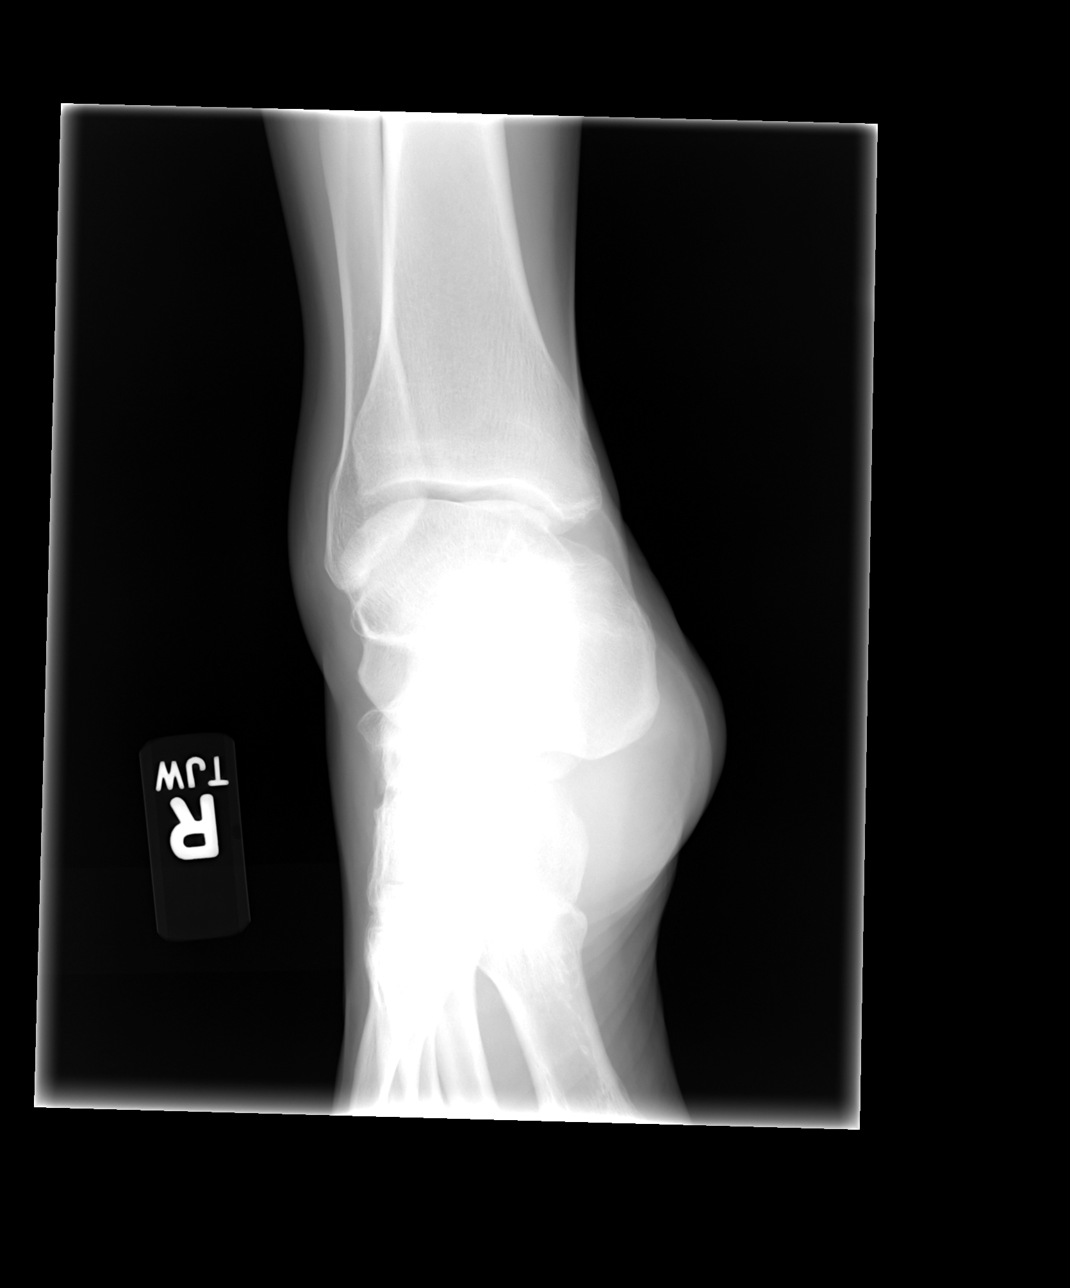

[view not recorded (2 of 3)]
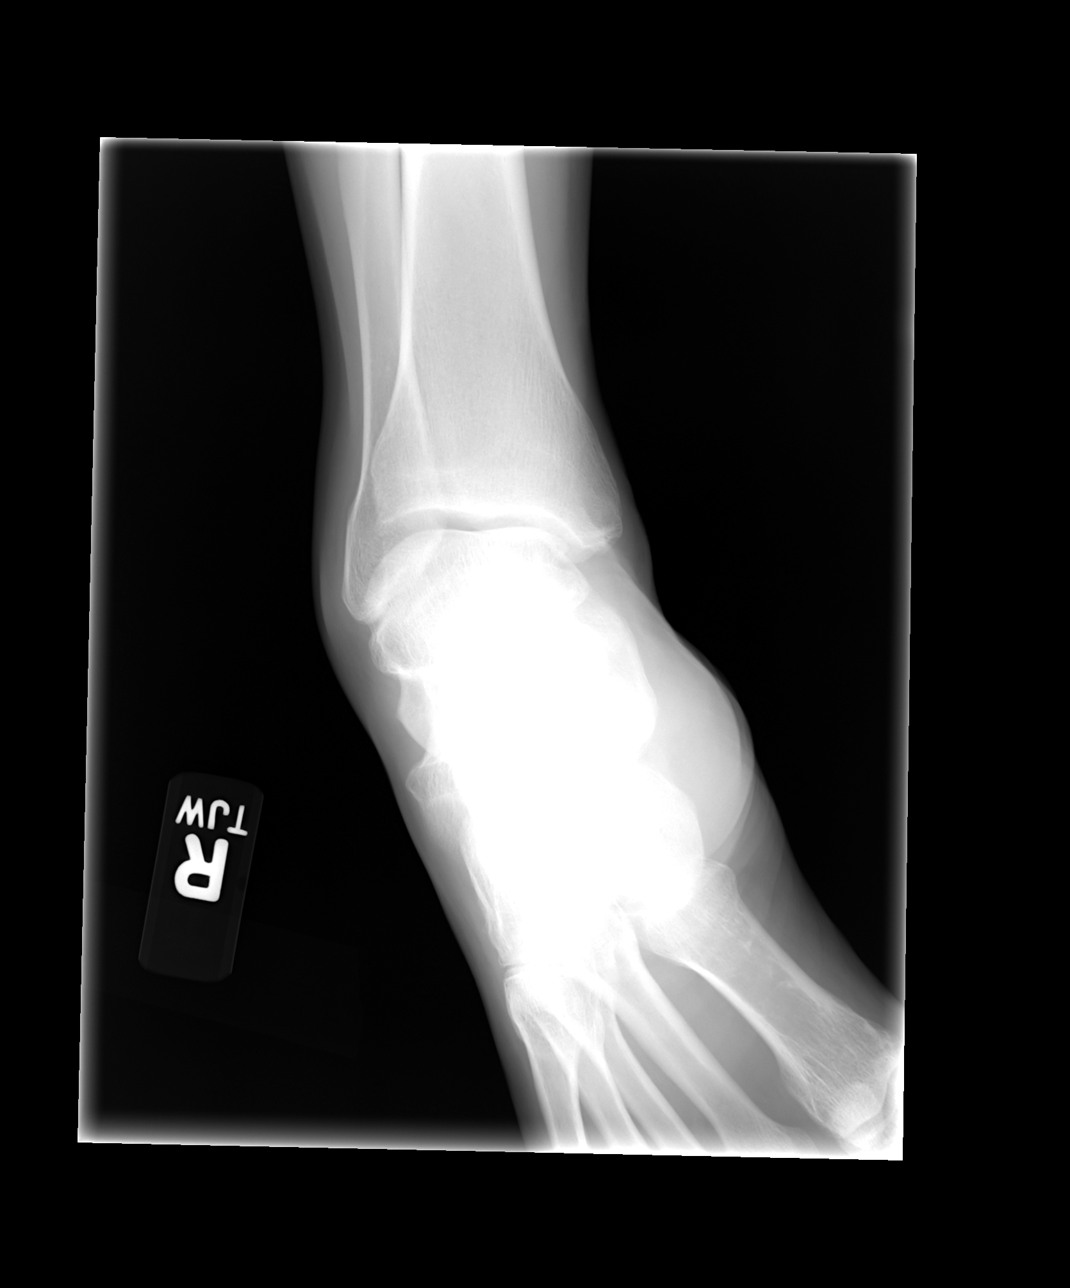

[view not recorded (3 of 3)]
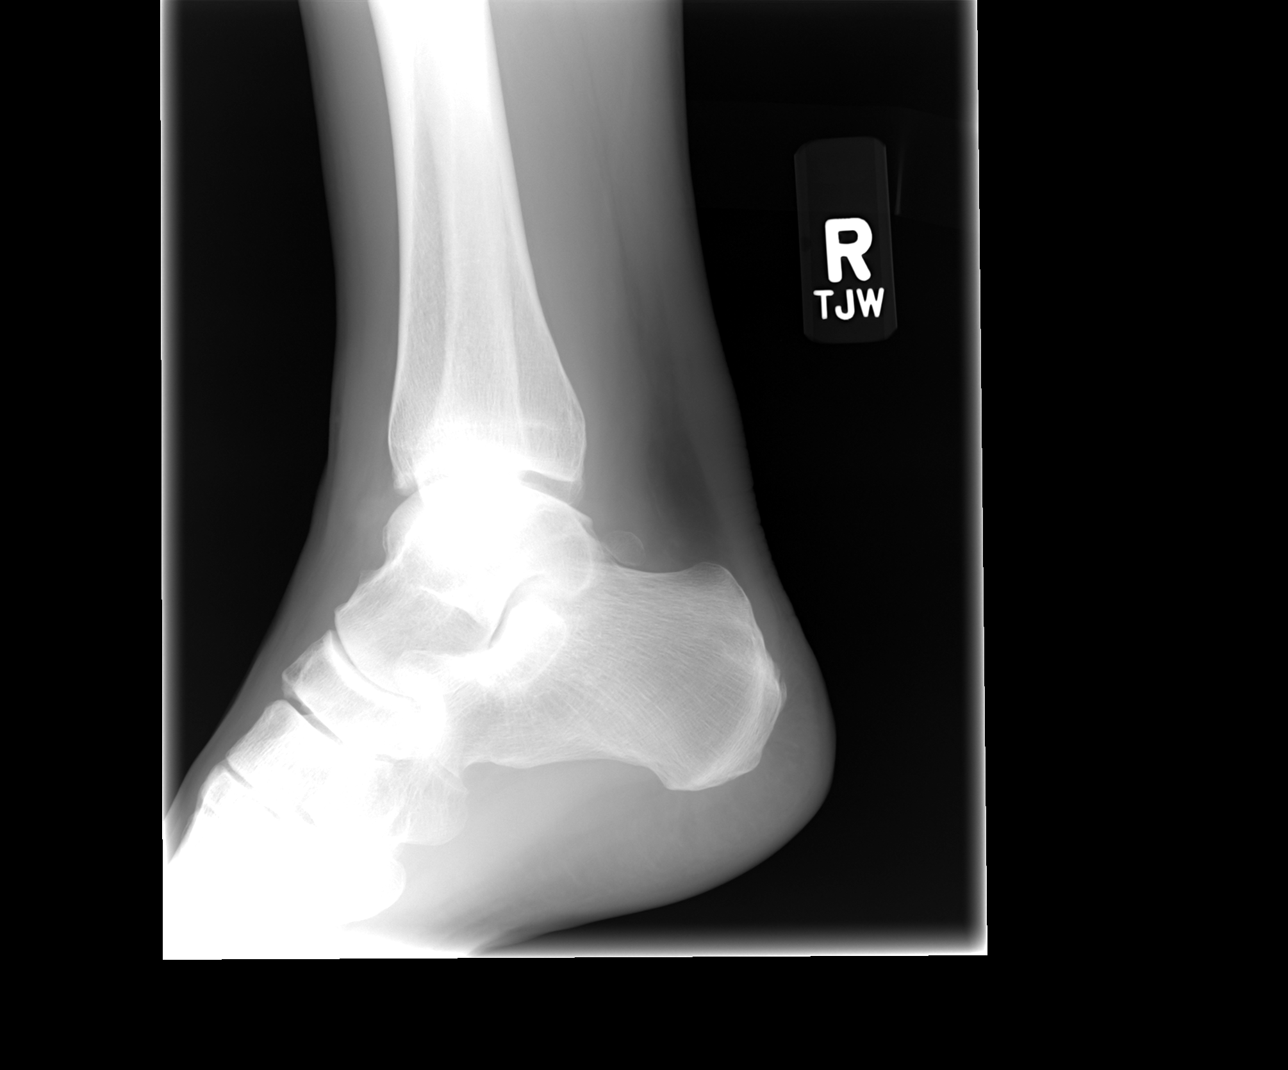

[3 of 3 positions shown; findings below may reference images not displayed]

FINDINGS: Three views of the right ankle demonstrate mild soft
tissue swelling overlying the lateral malleolus.  There appears to
be a tiny joint effusion in the tibioalar joint.  No acute
fracture, subluxation or dislocation is appreciated.
IMPRESSION: 1.  Small amount of soft tissue swelling over the lateral malleolus
and small tibiotalar joint effusion, without evidence of underlying
bony trauma.

## 2014-09-05 ENCOUNTER — Ambulatory Visit (INDEPENDENT_AMBULATORY_CARE_PROVIDER_SITE_OTHER): Payer: BLUE CROSS/BLUE SHIELD | Admitting: Podiatry

## 2014-09-05 ENCOUNTER — Ambulatory Visit (INDEPENDENT_AMBULATORY_CARE_PROVIDER_SITE_OTHER): Payer: BLUE CROSS/BLUE SHIELD

## 2014-09-05 ENCOUNTER — Other Ambulatory Visit: Payer: Self-pay | Admitting: Podiatry

## 2014-09-05 DIAGNOSIS — M19072 Primary osteoarthritis, left ankle and foot: Secondary | ICD-10-CM

## 2014-09-05 DIAGNOSIS — M79672 Pain in left foot: Secondary | ICD-10-CM

## 2014-09-05 MED ORDER — MELOXICAM 15 MG PO TABS: 15.0000 mg | ORAL_TABLET | Freq: Every day | ORAL | Status: DC

## 2014-09-05 NOTE — Progress Notes (Signed)
   Subjective:    Patient ID: RIC CRAFTS, male    DOB: January 23, 1971, 44 y.o.   MRN: [id number]  HPI Comments: "I have problems with this ankle"  Patient c/o aching medial and lateral ankle left for 10 years. He has had several sprains and fractures in the past from basketball. He did see doc at [facility] and said he needed surgery. Gave a cortisone injection-did help temp. Wears high top shoes to stabilize.     Review of Systems  Musculoskeletal: Positive for arthralgias and gait problem.  All other systems reviewed and are negative.      Objective:   Physical Exam: I have reviewed his past medical history medications allergies surgery social history and review of systems. Pulses are strongly palpable. Neurologic sensorium is intact per Semmes-Weinstein monofilament. Deep tendon reflexes are intact. Muscle strength is 5 over 5 dorsiflexion plantar flexors and inverters and everters onto the musculature is intact. Orthopedic evaluation demonstrates a large hypertrophic ankle left associated with significant trauma to the ankle resulting in spurring and hypertrophy of the bone. Full range of motion of the ankle was present though is painful on range of motion. Inversion and eversion of the subtalar joint and the distal joints are all equal and full. Radiograph confirms severe osteoarthritic changes and mortise instability of the left ankle. Spurring and soft tissue calcifications present.        Assessment & Plan:  Assessment: Severe osteoarthritis left ankle with pain.  Plan: Requesting a CT scan and will refer to Dr. Ardelle Anton for  Surgical consider.

## 2014-09-07 ENCOUNTER — Encounter: Payer: Self-pay | Admitting: Podiatry

## 2014-09-07 NOTE — Progress Notes (Unsigned)
Contacted ins. Regarding CT scan , no prior auth needed . Pt has CT scan 09/23/2014  At 8:30am

## 2014-09-23 ENCOUNTER — Ambulatory Visit
Admission: RE | Admit: 2014-09-23 | Discharge: 2014-09-23 | Disposition: A | Discharge status: Home / Self Care | Payer: BLUE CROSS/BLUE SHIELD | Source: Ambulatory Visit | Attending: Podiatry | Admitting: Podiatry

## 2014-09-23 DIAGNOSIS — M19072 Primary osteoarthritis, left ankle and foot: Secondary | ICD-10-CM | POA: Insufficient documentation

## 2014-09-23 DIAGNOSIS — M79672 Pain in left foot: Secondary | ICD-10-CM

## 2014-09-23 DIAGNOSIS — M659 Synovitis and tenosynovitis, unspecified: Secondary | ICD-10-CM | POA: Diagnosis not present

## 2014-09-28 NOTE — Progress Notes (Signed)
Called pt-let him know results per Dr Al CorpusHyatt and that he would like for him to see Dr. Ardelle AntonWagoner for surgical consult.Transferred to Tammy to schedule the appt.

## 2014-09-29 ENCOUNTER — Ambulatory Visit (INDEPENDENT_AMBULATORY_CARE_PROVIDER_SITE_OTHER): Payer: BLUE CROSS/BLUE SHIELD | Admitting: Family Medicine

## 2014-09-29 VITALS — BP 112/78 | HR 68 | Temp 97.7°F | Ht 73.0 in | Wt 223.1 lb

## 2014-09-29 DIAGNOSIS — L720 Epidermal cyst: Secondary | ICD-10-CM

## 2014-09-29 DIAGNOSIS — H8111 Benign paroxysmal vertigo, right ear: Secondary | ICD-10-CM | POA: Diagnosis not present

## 2014-09-29 DIAGNOSIS — IMO0002 Reserved for concepts with insufficient information to code with codable children: Secondary | ICD-10-CM

## 2014-09-29 NOTE — Progress Notes (Signed)
Dr. Karleen HampshireSpencer T. Deadrian Toya, MD, CAQ Sports Medicine [facility] [address] [place], [id number] Phone: [phone] Fax: [phone]  09/29/2014  Patient: Austin Nguyen Feggins, MRN: [id number], DOB: October 20, 1970, 44 y.o.  Primary Physician:  Roxy MannsMarne Tower, MD  Chief Complaint: Dizziness and Other  Subjective:   Austin Nguyen Canty is a 44 y.o. very pleasant male patient who presents with the following:  L sided cyst / knot outside of scrotal sac.  Not really all that tender.  He wanted to have somebody check this out.  It is not tender at all, it is not red, and is not warm.  It is approximately the size of a marble.  BPPV to the R that is gone, been gone > 1 week  Past Medical History, Surgical History, Social History, Family History, Problem List, Medications, and Allergies have been reviewed and updated if relevant.  Patient Active Problem List   Diagnosis Date Noted  . Panic disorder 09/21/2012  . Arthritis of ankle or foot, degenerative 09/05/2012  . Lumbar strain 04/21/2012  . Right ankle pain 10/30/2011  . Chewing tobacco use 10/30/2011  . HEARTBURN 12/19/2009  . Back pain with left-sided radiculopathy 09/22/2008  . PANIC DISORDER 08/31/2008  . EUSTACHIAN TUBE DYSFUNCTION, LEFT 08/24/2008  . SHOULDER PAIN 10/15/2007    No past medical history on file.  No past surgical history on file.  History   Social History  . Marital Status: Married    Spouse Name: N/A  . Number of Children: N/A  . Years of Education: N/A   Occupational History  . Not on file.   Social History Main Topics  . Smoking status: Never Smoker   . Smokeless tobacco: Not on file  . Alcohol Use: 0.0 oz/week    0 Standard drinks or equivalent per week     Comment: occ  . Drug Use: No  . Sexual Activity: Not on file   Other Topics Concern  . Not on file   Social History Narrative    Family History  Problem Relation Age of Onset  . Cancer Mother     lung cancer non  smoker    No Known Allergies  Medication list reviewed and updated in full in [facility] [facility].  ROS: GEN: Acute illness details above GI: Tolerating PO intake GU: maintaining adequate hydration and urination Pulm: No SOB Interactive and getting along well at home.  Otherwise, ROS is as per the HPI.   Objective:   BP 112/78 mmHg  Pulse 68  Temp(Src) 97.7 F (36.5 Nguyen) (Oral)  Ht 6\' 1"  (1.854 m)  Wt 223 lb 1.9 oz (101.207 kg)  BMI 29.44 kg/m2  SpO2 99%   GEN: WDWN, NAD, Non-toxic, Alert & Oriented x 3 HEENT: Atraumatic, Normocephalic.  Ears and Nose: No external deformity. EXTR: No clubbing/cyanosis/edema NEURO: Normal gait.  PSYCH: Normally interactive. Conversant. Not depressed or anxious appearing.  Calm demeanor.   GU: normal male anatomically.  Normal testes.  Testicular exam is normal.  Exterior to the testicular sac on the left there is a small cyst approximately the size of a marble that is movable, nontender, not red.   Laboratory and Imaging Data:  Assessment and Plan:   Groin cyst  BPPV (benign paroxysmal positional vertigo), right  I reassured him about his cyst in the groin.  Certainly exterior to the testicular sac, and most likely is a small cyst versus potentially a lymph node from a ingrown hair.  BPPV has completely resolved.  Signed,  Elpidio Galea. Emonni Depasquale, MD   Patient's Medications  New Prescriptions   No medications on file  Previous Medications   ALPRAZOLAM (XANAX) 0.5 MG TABLET    Take 1 tablet (0.5 mg total) by mouth at bedtime as needed for sleep.   BUSPIRONE (BUSPAR) 15 MG TABLET    Take 0.5 tablets (7.5 mg total) by mouth 2 (two) times daily.   CYCLOBENZAPRINE (FLEXERIL) 10 MG TABLET    Take 1 tablet (10 mg total) by mouth 3 (three) times daily as needed for muscle spasms.   MELOXICAM (MOBIC) 15 MG TABLET    TAKE 1 TABLET (15 MG TOTAL) BY MOUTH DAILY. WITH FOOD AS NEEDED FOR BACK PAIN  Modified Medications   No medications on file    Discontinued Medications   MELOXICAM (MOBIC) 15 MG TABLET    Take 1 tablet (15 mg total) by mouth daily.

## 2014-09-29 NOTE — Progress Notes (Signed)
Pre visit review using our clinic review tool, if applicable. No additional management support is needed unless otherwise documented below in the visit note. 

## 2014-09-30 ENCOUNTER — Encounter: Payer: Self-pay | Admitting: Family Medicine

## 2014-10-13 ENCOUNTER — Ambulatory Visit (INDEPENDENT_AMBULATORY_CARE_PROVIDER_SITE_OTHER): Payer: BLUE CROSS/BLUE SHIELD | Admitting: Podiatry

## 2014-10-13 ENCOUNTER — Encounter: Payer: Self-pay | Admitting: Podiatry

## 2014-10-13 VITALS — BP 106/72 | HR 83 | Resp 16

## 2014-10-13 DIAGNOSIS — M19072 Primary osteoarthritis, left ankle and foot: Secondary | ICD-10-CM

## 2014-10-13 NOTE — Progress Notes (Signed)
Patient ID: Austin Nguyen, male   DOB: September 13, 1970, 44 y.o.   MRN: [id number]  Subjective: 44 year old male presents the office today for surgical consultation of left ankle pain. He states that he has had pain for approximately 10 years and he has pain on the consistent basis every day. He has had 2 injuries to the ankle one in 2006 and one in 2014. He states his pain returned of the ankle. He does wear an ankle brace which seems to help however minimally. At this time he is inquiring about surgical intervention. No other complaints this time.  Objective: AAO 3, NAD DP/PT pulses palpable, CRT less than 3 seconds Protective sensation intact with Simms Weinstein monofilament, Achilles tendon reflex intact. There is pain with ankle joint and subtalar joint range of motion. There is restriction of motion about the ankle and subtalar joint. There is no symmetric in tenderness along the talonavicular joint. There is mild chronic edema to the left ankle. There is tenderness mostly over the anteromedial, anterolateral aspects of the ankle gutter as well as the lateral aspect of the sinus tarsi. No other areas of tenderness to bilateral lower extremities. No other areas of edema, erythema, increase in warmth bilaterally. No open lesions or pre-ulcerative lesions. There is no pain with calf compression, swelling, warmth, erythema.  Assessment: 44 year old male with left ankle, subtalar, TN joint arthritis  Plan: -Previous x-rays were reviewed the patient as well as CT scan. -I discussed surgical invention with the patient. I discussed with him possible fusion. However she'll likely need an ankle and subtalar joint fusion if not a pan-talar fusion. Also discussed the possibility of an ankle replacement with subtalar joint fusion. I discussed with him pros and cons of both surgeries. I recommend him to have another opinion at [facility] he was given the name of physician out there for an opinion and I do not do  ankle replacements at this time. -I gave him copies of the CT scan and I recommended him to get the copy the disc from the hospital to bring with him to that appointment. -Should he elect to undergo surgery for the fusion, discussed that I can do the surgery in to call others appointment at [facility]. Call with any questions/concers in the meantime.   Ovid Curd, DPM

## 2014-10-17 ENCOUNTER — Ambulatory Visit (INDEPENDENT_AMBULATORY_CARE_PROVIDER_SITE_OTHER): Payer: BLUE CROSS/BLUE SHIELD | Admitting: Family Medicine

## 2014-10-17 ENCOUNTER — Encounter: Payer: Self-pay | Admitting: Family Medicine

## 2014-10-17 VITALS — BP 108/76 | HR 73 | Temp 98.1°F | Ht 73.0 in | Wt 217.0 lb

## 2014-10-17 DIAGNOSIS — L0390 Cellulitis, unspecified: Secondary | ICD-10-CM

## 2014-10-17 DIAGNOSIS — L03031 Cellulitis of right toe: Secondary | ICD-10-CM

## 2014-10-17 HISTORY — DX: Cellulitis, unspecified: L03.90

## 2014-10-17 MED ORDER — CEPHALEXIN 500 MG PO CAPS: 500.0000 mg | ORAL_CAPSULE | Freq: Three times a day (TID) | ORAL | Status: DC

## 2014-10-17 NOTE — Assessment & Plan Note (Signed)
With hx of blister overlying corn under 5th toe Some erythema and a red streak noted going up and across foot  tx with tid Keflex 500 mg -with food (update if side eff) Elevate foot/warm compress/out of work today  Update if worse or fever  Update if no improvement in several days

## 2014-10-17 NOTE — Patient Instructions (Signed)
I think you have a skin infection of the foot (cellulitis) Keep it very clean with soap and water Antibiotic ointment is ok  Elevate/ use a warm compress as much as you can  Stay out of work today   If increased pain/ redness/streaking/swelling or any fever -call us immediately   If not improved by Wed - make an appt later in the week to check it again

## 2014-10-17 NOTE — Progress Notes (Signed)
Pre visit review using our clinic review tool, if applicable. No additional management support is needed unless otherwise documented below in the visit note. 

## 2014-10-17 NOTE — Progress Notes (Signed)
Subjective:    Patient ID: Austin Nguyen, male    DOB: [birth year], 44 y.o.   MRN: [id number]  HPI Here with blisters on R foot   Wore new orthotics (otc) to help feet - and they rubbed his foot  Last week (end of the week)  Using abx oint and spray over the counter and soap and water   Some drainage from one of them   Has a streak going up lateral foot   Patient Active Problem List   Diagnosis Date Noted  . Panic disorder 09/21/2012  . Arthritis of ankle or foot, degenerative 09/05/2012  . Lumbar strain 04/21/2012  . Right ankle pain 10/30/2011  . Chewing tobacco use 10/30/2011  . HEARTBURN 12/19/2009  . Back pain with left-sided radiculopathy 09/22/2008  . PANIC DISORDER 08/31/2008  . EUSTACHIAN TUBE DYSFUNCTION, LEFT 08/24/2008  . SHOULDER PAIN 10/15/2007   No past medical history on file. No past surgical history on file. History  Substance Use Topics  . Smoking status: Never Smoker   . Smokeless tobacco: Not on file  . Alcohol Use: 0.0 oz/week    0 Standard drinks or equivalent per week     Comment: occ   Family History  Problem Relation Age of Onset  . Cancer Mother     lung cancer non smoker   No Known Allergies Current Outpatient Prescriptions on File Prior to Visit  Medication Sig Dispense Refill  . ALPRAZolam (XANAX) 0.5 MG tablet Take 1 tablet (0.5 mg total) by mouth at bedtime as needed for sleep. 10 tablet 0  . busPIRone (BUSPAR) 15 MG tablet Take 0.5 tablets (7.5 mg total) by mouth 2 (two) times daily. 30 tablet 5  . cyclobenzaprine (FLEXERIL) 10 MG tablet Take 1 tablet (10 mg total) by mouth 3 (three) times daily as needed for muscle spasms. 30 tablet 0  . meloxicam (MOBIC) 15 MG tablet TAKE 1 TABLET (15 MG TOTAL) BY MOUTH DAILY. WITH FOOD AS NEEDED FOR BACK PAIN 30 tablet 2   No current facility-administered medications on file prior to visit.      Review of Systems Review of Systems  Constitutional: Negative for fever, appetite change,  fatigue and unexpected weight change.  Eyes: Negative for pain and visual disturbance.  Respiratory: Negative for cough and shortness of breath.   Cardiovascular: Negative for cp or palpitations    Gastrointestinal: Negative for nausea, diarrhea and constipation.  Genitourinary: Negative for urgency and frequency.  Skin: Negative for pallor or rash  pos for blisters of R foot and pain  Neurological: Negative for weakness, light-headedness, numbness and headaches.  Hematological: Negative for adenopathy. Does not bruise/bleed easily.  Psychiatric/Behavioral: Negative for dysphoric mood. The patient is not nervous/anxious.         Objective:   Physical Exam  Constitutional: He appears well-developed and well-nourished. No distress.  HENT:  Head: Normocephalic and atraumatic.  Neck: Normal range of motion. Neck supple.  Cardiovascular: Normal rate, regular rhythm and intact distal pulses.   Neurological: He is alert. He has normal reflexes.  Skin: Skin is warm and dry. No rash noted. There is erythema. No pallor.  Dry/healing blister over medial foot (MTP area)   Blister with erythema/warmth and tenderness (also a corn) under head of 5th metatarsal Erythema extends up toe with a small streak of redness across foot   Foot is well perfused with no neurol changes   Psychiatric: He has a normal mood and affect.  Assessment & Plan:   Problem List Items Addressed This Visit    Cellulitis - Primary    With hx of blister overlying corn under 5th toe Some erythema and a red streak noted going up and across foot  tx with tid Keflex 500 mg -with food (update if side eff) Elevate foot/warm compress/out of work today  Update if worse or fever  Update if no improvement in several days

## 2014-10-26 DIAGNOSIS — M19072 Primary osteoarthritis, left ankle and foot: Secondary | ICD-10-CM | POA: Insufficient documentation

## 2014-10-26 DIAGNOSIS — M19079 Primary osteoarthritis, unspecified ankle and foot: Secondary | ICD-10-CM | POA: Insufficient documentation

## 2014-10-26 HISTORY — DX: Primary osteoarthritis, left ankle and foot: M19.072

## 2014-11-23 DIAGNOSIS — K219 Gastro-esophageal reflux disease without esophagitis: Secondary | ICD-10-CM | POA: Insufficient documentation

## 2014-11-23 DIAGNOSIS — M21179 Varus deformity, not elsewhere classified, unspecified ankle: Secondary | ICD-10-CM | POA: Insufficient documentation

## 2014-11-23 HISTORY — DX: Gastro-esophageal reflux disease without esophagitis: K21.9

## 2014-11-24 HISTORY — PX: JOINT REPLACEMENT: SHX530

## 2014-12-06 DIAGNOSIS — Q666 Other congenital valgus deformities of feet: Secondary | ICD-10-CM | POA: Insufficient documentation

## 2014-12-06 DIAGNOSIS — M25373 Other instability, unspecified ankle: Secondary | ICD-10-CM | POA: Insufficient documentation

## 2014-12-06 DIAGNOSIS — Z9660 Presence of unspecified orthopedic joint implant: Secondary | ICD-10-CM | POA: Insufficient documentation

## 2014-12-06 DIAGNOSIS — S8253XA Displaced fracture of medial malleolus of unspecified tibia, initial encounter for closed fracture: Secondary | ICD-10-CM | POA: Insufficient documentation

## 2014-12-13 DIAGNOSIS — Z471 Aftercare following joint replacement surgery: Secondary | ICD-10-CM | POA: Insufficient documentation

## 2015-04-14 ENCOUNTER — Encounter: Payer: Self-pay | Admitting: Family Medicine

## 2015-04-14 ENCOUNTER — Ambulatory Visit (INDEPENDENT_AMBULATORY_CARE_PROVIDER_SITE_OTHER): Payer: BLUE CROSS/BLUE SHIELD | Admitting: Family Medicine

## 2015-04-14 VITALS — BP 118/78 | HR 95 | Temp 98.0°F | Ht 73.0 in | Wt 225.2 lb

## 2015-04-14 DIAGNOSIS — Z23 Encounter for immunization: Secondary | ICD-10-CM

## 2015-04-14 DIAGNOSIS — S99922A Unspecified injury of left foot, initial encounter: Secondary | ICD-10-CM | POA: Diagnosis not present

## 2015-04-14 DIAGNOSIS — L03032 Cellulitis of left toe: Secondary | ICD-10-CM | POA: Diagnosis not present

## 2015-04-14 MED ORDER — LEVOFLOXACIN 500 MG PO TABS: 500.0000 mg | ORAL_TABLET | Freq: Every day | ORAL | Status: DC

## 2015-04-14 NOTE — Assessment & Plan Note (Signed)
L 4th toe after stubbing and abrading it Monday (of note-had ankle reconstruction this fall on that side)  Redness just began yesterday  inst to clean with antibacterial soap and water and use abx oint and loose dressing Take levaquin as directed 500 mg daily for 7 d Elevate/warm compress prn Do not want to risk ankle infection   Update if not starting to improve in a week or if worsening   Disc red flags to watch for -see AVS

## 2015-04-14 NOTE — Patient Instructions (Signed)
You have a skin infection of the toe Keep clean with antibacterial soap and water and use any antibacterial ointment  You can loosely cover the wound with band aid or gauze  Elevate foot when you can  (a warm compress is ok also ) Tetanus shot today   If the redness gets worse or streaks up your foot /or fever or drainage from wound - please alert [place] immediately Update if not starting to improve in a week or if worsening

## 2015-04-14 NOTE — Progress Notes (Signed)
Subjective:    Patient ID: Austin Nguyen, male    DOB: 1970-06-20, 45 y.o.   MRN: [id number]  HPI Monday- injured toe  Walking through kitchen- and stubbed his 4th toe  Heard and felt a pop  Hurt a lot  Started swelling yesterday worrried about infection   Uses peroxide and neosporin   Does not think he broke the toe  Sore laterally  The cut hurts more than the toe   Works on his feet at [employer]    Just had ankle surgery in Sept - doing better so far  Better rom   Patient Active Problem List   Diagnosis Date Noted  . Cellulitis of toe of left foot 04/14/2015  . Cellulitis 10/17/2014  . Panic disorder 09/21/2012  . Arthritis of ankle or foot, degenerative 09/05/2012  . Lumbar strain 04/21/2012  . Right ankle pain 10/30/2011  . Chewing tobacco use 10/30/2011  . HEARTBURN 12/19/2009  . Back pain with left-sided radiculopathy 09/22/2008  . PANIC DISORDER 08/31/2008  . EUSTACHIAN TUBE DYSFUNCTION, LEFT 08/24/2008  . SHOULDER PAIN 10/15/2007   No past medical history on file. No past surgical history on file. Social History  Substance Use Topics  . Smoking status: Never Smoker   . Smokeless tobacco: None  . Alcohol Use: 0.0 oz/week    0 Standard drinks or equivalent per week     Comment: occ   Family History  Problem Relation Age of Onset  . Cancer Mother     lung cancer non smoker   No Known Allergies No current outpatient prescriptions on file prior to visit.   No current facility-administered medications on file prior to visit.    Review of Systems    Review of Systems  Constitutional: Negative for fever, appetite change, fatigue and unexpected weight change.  Eyes: Negative for pain and visual disturbance.  Respiratory: Negative for cough and shortness of breath.   Cardiovascular: Negative for cp or palpitations    Gastrointestinal: Negative for nausea, diarrhea and constipation.  Genitourinary: Negative for urgency and frequency.  Skin: Negative  for pallor or rash  pos for redness of L 4th toe with tenderness Neurological: Negative for weakness, light-headedness, numbness and headaches.  Hematological: Negative for adenopathy. Does not bruise/bleed easily.  Psychiatric/Behavioral: Negative for dysphoric mood. The patient is not nervous/anxious.      Objective:   Physical Exam  Constitutional: He appears well-developed and well-nourished. No distress.  Well appearing   HENT:  Head: Normocephalic and atraumatic.  Eyes: EOM are normal. Pupils are equal, round, and reactive to light. No scleral icterus.  Cardiovascular: Normal rate and regular rhythm.   Musculoskeletal:       Left foot: There is tenderness, bony tenderness and swelling. There is normal range of motion, no crepitus and no laceration.  Baseline deformity/post op changes of L ankle with improved rom   L foot- erythema of 4th toe w/o streaking Small abrasion laterally - less than 1/2 cm  No drainage Mild swelling Nl active and passive rom Nl sens and strength and perfusion   No other tenderness   Neurological: He is alert. He has normal strength. He displays no atrophy. No sensory deficit. He exhibits normal muscle tone.  Skin: Skin is warm and dry. No rash noted. There is erythema. No pallor.  Psychiatric: He has a normal mood and affect.    Problem List Items Addressed This Visit      Other   Cellulitis of toe of  left foot - Primary    L 4th toe after stubbing and abrading it Monday (of note-had ankle reconstruction this fall on that side)  Redness just began yesterday  inst to clean with antibacterial soap and water and use abx oint and loose dressing Take levaquin as directed 500 mg daily for 7 d Elevate/warm compress prn Do not want to risk ankle infection   Update if not starting to improve in a week or if worsening   Disc red flags to watch for -see AVS       Other Visit Diagnoses    Toe injury, left, initial encounter        Relevant Orders      Tdap vaccine greater than or equal to 7yo IM (Completed)            Assessment & Plan:

## 2015-04-14 NOTE — Progress Notes (Signed)
Pre visit review using our clinic review tool, if applicable. No additional management support is needed unless otherwise documented below in the visit note. 

## 2015-09-22 DIAGNOSIS — Z792 Long term (current) use of antibiotics: Secondary | ICD-10-CM | POA: Insufficient documentation

## 2015-09-22 DIAGNOSIS — K8020 Calculus of gallbladder without cholecystitis without obstruction: Secondary | ICD-10-CM | POA: Insufficient documentation

## 2015-09-22 DIAGNOSIS — M1990 Unspecified osteoarthritis, unspecified site: Secondary | ICD-10-CM | POA: Insufficient documentation

## 2015-09-22 DIAGNOSIS — R1011 Right upper quadrant pain: Secondary | ICD-10-CM | POA: Diagnosis present

## 2015-09-22 NOTE — ED Notes (Signed)
Pt c/o epigastric pain starting at 2000 tonight. Pt states he took prilosec and tums w/o relief. Pt vomited x 2 at 2230. Pt presents w/ c/o epigastric pain that is uncontrolled. Pt is restless and pale during triage.

## 2015-09-23 ENCOUNTER — Emergency Department
Admission: EM | Admit: 2015-09-23 | Discharge: 2015-09-23 | Disposition: A | Discharge status: Home / Self Care | Payer: BLUE CROSS/BLUE SHIELD | Attending: Emergency Medicine | Admitting: Emergency Medicine

## 2015-09-23 ENCOUNTER — Encounter: Payer: Self-pay | Admitting: *Deleted

## 2015-09-23 ENCOUNTER — Emergency Department: Payer: BLUE CROSS/BLUE SHIELD

## 2015-09-23 DIAGNOSIS — K8020 Calculus of gallbladder without cholecystitis without obstruction: Secondary | ICD-10-CM

## 2015-09-23 LAB — COMPREHENSIVE METABOLIC PANEL
ALBUMIN: 4.3 g/dL (ref 3.5–5.0)
ALT: 25 U/L (ref 17–63)
AST: 30 U/L (ref 15–41)
Alkaline Phosphatase: 91 U/L (ref 38–126)
Anion gap: 10 (ref 5–15)
BUN: 13 mg/dL (ref 6–20)
CHLORIDE: 102 mmol/L (ref 101–111)
CO2: 24 mmol/L (ref 22–32)
CREATININE: 1.06 mg/dL (ref 0.61–1.24)
Calcium: 9.5 mg/dL (ref 8.9–10.3)
GFR calc Af Amer: >60 mL/min (ref >60)
GFR calc non Af Amer: >60 mL/min (ref >60)
GLUCOSE: 120 mg/dL — AB (ref 65–99)
POTASSIUM: 3.9 mmol/L (ref 3.5–5.1)
Sodium: 136 mmol/L (ref 135–145)
Total Bilirubin: 0.6 mg/dL (ref 0.3–1.2)
Total Protein: 8.2 g/dL — ABNORMAL HIGH (ref 6.5–8.1)

## 2015-09-23 LAB — URINALYSIS COMPLETE WITH MICROSCOPIC (ARMC ONLY)
BILIRUBIN URINE: NEGATIVE
Bacteria, UA: NONE SEEN
Glucose, UA: NEGATIVE mg/dL
Hgb urine dipstick: NEGATIVE
LEUKOCYTES UA: NEGATIVE
Nitrite: NEGATIVE
PH: 7 (ref 5.0–8.0)
Protein, ur: NEGATIVE mg/dL
SPECIFIC GRAVITY, URINE: 1.024 (ref 1.005–1.030)
Squamous Epithelial / LPF: NONE SEEN

## 2015-09-23 LAB — CBC
HEMATOCRIT: 44.1 % (ref 40.0–52.0)
Hemoglobin: 14.5 g/dL (ref 13.0–18.0)
MCH: 29.6 pg (ref 26.0–34.0)
MCHC: 32.9 g/dL (ref 32.0–36.0)
MCV: 90.1 fL (ref 80.0–100.0)
Platelets: 279 10*3/uL (ref 150–440)
RBC: 4.9 MIL/uL (ref 4.40–5.90)
RDW: 12.9 % (ref 11.5–14.5)
WBC: 13.6 10*3/uL — ABNORMAL HIGH (ref 3.8–10.6)

## 2015-09-23 LAB — LIPASE, BLOOD: Lipase: 22 U/L (ref 11–51)

## 2015-09-23 MED ORDER — ONDANSETRON 4 MG PO TBDP: ORAL_TABLET | ORAL | Status: DC

## 2015-09-23 MED ORDER — OXYCODONE-ACETAMINOPHEN 5-325 MG PO TABS: 1.0000 | ORAL_TABLET | ORAL | Status: DC | PRN

## 2015-09-23 MED ORDER — OXYCODONE-ACETAMINOPHEN 5-325 MG PO TABS
2.0000 | ORAL_TABLET | Freq: Once | ORAL | Status: AC
Start: 2015-09-23 — End: 2015-09-23
  Administered 2015-09-23: 2 via ORAL
  Filled 2015-09-23: qty 2

## 2015-09-23 MED ORDER — ONDANSETRON HCL 4 MG/2ML IJ SOLN
4.0000 mg | INTRAMUSCULAR | Status: AC
  Administered 2015-09-23: 4 mg via INTRAVENOUS
  Filled 2015-09-23: qty 2

## 2015-09-23 MED ORDER — MORPHINE SULFATE (PF) 4 MG/ML IV SOLN
4.0000 mg | Freq: Once | INTRAVENOUS | Status: AC
  Administered 2015-09-23: 4 mg via INTRAVENOUS
  Filled 2015-09-23: qty 1

## 2015-09-23 MED ORDER — DOCUSATE SODIUM 100 MG PO CAPS: ORAL_CAPSULE | ORAL | Status: DC

## 2015-09-23 NOTE — Discharge Instructions (Signed)
You have been seen in the Emergency Department (ED) for abdominal pain.  Your evaluation suggests that your pain is caused by gallstones.  Fortunately you do not need immediate surgery at this time, but it is important that you follow up with a surgeon as an outpatient; typically surgical removal of the gallbladder is the only thing that will definitively fix your issue.  Read through the included information about a bland diet, and use any prescribed medications as instructed.  Avoid smoking and alcohol use.  Please follow up as instructed above regarding todays emergent visit and the symptoms that are bothering you.  Take Percocet as prescribed. Do not drink alcohol, drive or participate in any other potentially dangerous activities while taking this medication as it may make you sleepy. Do not take this medication with any other sedating medications, either prescription or over-the-counter. If you were prescribed Percocet or Vicodin, do not take these with acetaminophen (Tylenol) as it is already contained within these medications.   This medication is an opiate (or narcotic) pain medication and can be habit forming.  Use it as little as possible to achieve adequate pain control.  Do not use or use it with extreme caution if you have a history of opiate abuse or dependence.  If you are on a pain contract with your primary care doctor or a pain specialist, be sure to let them know you were prescribed this medication today from the [facility].  This medication is intended for your use only - do not give any to anyone else and keep it in a secure place where nobody else, especially children, have access to it.  It will also cause or worsen constipation, so you may want to consider taking an over-the-counter stool softener while you are taking this medication.  Return to the ED if your abdominal pain worsens or fails to improve, you develop bloody vomiting, bloody diarrhea, you  are unable to tolerate fluids due to vomiting, fever greater than 101, or other symptoms that concern you.   Cholelithiasis Cholelithiasis (also called gallstones) is a form of gallbladder disease in which gallstones form in your gallbladder. The gallbladder is an organ that stores bile made in the liver, which helps digest fats. Gallstones begin as small crystals and slowly grow into stones. Gallstone pain occurs when the gallbladder spasms and a gallstone is blocking the duct. Pain can also occur when a stone passes out of the duct.  RISK FACTORS  Being male.   Having multiple pregnancies. Health care providers sometimes advise removing diseased gallbladders before future pregnancies.   Being obese.  Eating a diet heavy in fried foods and fat.   Being older than 60 years and increasing age.   Prolonged use of medicines containing male hormones.   Having diabetes mellitus.   Rapidly losing weight.   Having a family history of gallstones (heredity).  SYMPTOMS  Nausea.   Vomiting.  Abdominal pain.   Yellowing of the skin (jaundice).   Sudden pain. It may persist from several minutes to several hours.  Fever.   Tenderness to the touch. In some cases, when gallstones do not move into the bile duct, people have no pain or symptoms. These are called "silent" gallstones.  TREATMENT Silent gallstones do not need treatment. In severe cases, emergency surgery may be required. Options for treatment include:  Surgery to remove the gallbladder. This is the most common treatment.  Medicines. These do not always work and may take 6-12  months or more to work.  Shock wave treatment (extracorporeal biliary lithotripsy). In this treatment an ultrasound machine sends shock waves to the gallbladder to break gallstones into smaller pieces that can pass into the intestines or be dissolved by medicine. HOME CARE INSTRUCTIONS   Only take over-the-counter or prescription  medicines for pain, discomfort, or fever as directed by your health care provider.   Follow a low-fat diet until seen again by your health care provider. Fat causes the gallbladder to contract, which can result in pain.   Follow up with your health care provider as directed. Attacks are almost always recurrent and surgery is usually required for permanent treatment.  SEEK IMMEDIATE MEDICAL CARE IF:   Your pain increases and is not controlled by medicines.   You have a fever or persistent symptoms for more than 2-3 days.   You have a fever and your symptoms suddenly get worse.   You have persistent nausea and vomiting.  MAKE SURE YOU:   Understand these instructions.  Will watch your condition.  Will get help right away if you are not doing well or get worse.   This information is not intended to replace advice given to you by your health care provider. Make sure you discuss any questions you have with your health care provider.   Document Released: 03/07/2005 Document Revised: 11/11/2012 Document Reviewed: 09/02/2012 Elsevier Interactive Patient Education 2016 Elsevier Inc.   Biliary Colic Biliary colic is a pain in the upper abdomen. The pain:  Is usually felt on the right side of the abdomen, but it may also be felt in the center of the abdomen, just below the breastbone (sternum).  May spread back toward the right shoulder blade.  May be steady or irregular.  May be accompanied by nausea and vomiting. Most of the time, the pain goes away in 1-5 hours. After the most intense pain passes, the abdomen may continue to ache mildly for about 24 hours. Biliary colic is caused by a blockage in the bile duct. The bile duct is a pathway that carries bile--a liquid that helps to digest fats--from the gallbladder to the small intestine. Biliary colic usually occurs after eating, when the digestive system demands bile. The pain develops when muscle cells contract forcefully to  try to move the blockage so that bile can get by. HOME CARE INSTRUCTIONS  Take medicines only as directed by your health care provider.  Drink enough fluid to keep your urine clear or pale yellow.  Avoid fatty, greasy, and fried foods. These kinds of foods increase your body's demand for bile.  Avoid any foods that make your pain worse.  Avoid overeating.  Avoid having a large meal after fasting. SEEK MEDICAL CARE IF:  You develop a fever.  Your pain gets worse.  You vomit.  You develop nausea that prevents you from eating and drinking. SEEK IMMEDIATE MEDICAL CARE IF:  You suddenly develop a fever and shaking chills.  You develop a yellowish discoloration (jaundice) of:  Skin.  Whites of the eyes.  Mucous membranes.  You have continuous or severe pain that is not relieved with medicines.  You have nausea and vomiting that is not relieved with medicines.  You develop dizziness or you faint.   This information is not intended to replace advice given to you by your health care provider. Make sure you discuss any questions you have with your health care provider.   Document Released: 08/12/2005 Document Revised: 07/26/2014 Document Reviewed: 12/21/2013 Elsevier  Interactive Patient Education ©2016 Elsevier Inc. ° °

## 2015-09-23 NOTE — ED Provider Notes (Addendum)
[facility] Emergency Department Provider Note  ____________________________________________  Time seen: Approximately 12:29 AM  I have reviewed the triage vital signs and the nursing notes.   HISTORY  Chief Complaint Abdominal Pain    HPI Austin Nguyen is a 45 y.o. male with no significant past medical history who presents with acute onset severe sharp and aching epigastric pain that started several hours ago.  It took a little while after eating a hamburger.  He reports nausea and emesis 4.  The pain is not radiating although it is present in both epigastrium and the right upper quadrant.  Movement makes it worse and holding still makes it a little bit better.  He has never had similar pain in the past.  He has never had any abdominal surgeries.  He denies chest pain, shortness of breath, fever/chills.  He has had one loose stool tonight.   History reviewed. No pertinent past medical history.  Patient Active Problem List   Diagnosis Date Noted  . Cellulitis of toe of left foot 04/14/2015  . Cellulitis 10/17/2014  . Panic disorder 09/21/2012  . Arthritis of ankle or foot, degenerative 09/05/2012  . Lumbar strain 04/21/2012  . Right ankle pain 10/30/2011  . Chewing tobacco use 10/30/2011  . HEARTBURN 12/19/2009  . Back pain with left-sided radiculopathy 09/22/2008  . PANIC DISORDER 08/31/2008  . EUSTACHIAN TUBE DYSFUNCTION, LEFT 08/24/2008  . SHOULDER PAIN 10/15/2007    Past Surgical History  Procedure Laterality Date  . Joint replacement      Current Outpatient Rx  Name  Route  Sig  Dispense  Refill  . docusate sodium (COLACE) 100 MG capsule      Take 1 tablet once or twice daily as needed for constipation while taking narcotic pain medicine   30 capsule   0   . levofloxacin (LEVAQUIN) 500 MG tablet   Oral   Take 1 tablet (500 mg total) by mouth daily. Take with food   10 tablet   0   . ondansetron (ZOFRAN ODT) 4 MG  disintegrating tablet      Allow 1-2 tablets to dissolve in your mouth every 8 hours as needed for nausea/vomiting   30 tablet   0   . oxyCODONE-acetaminophen (ROXICET) 5-325 MG tablet   Oral   Take 1-2 tablets by mouth every 4 (four) hours as needed for severe pain.   30 tablet   0     Allergies Review of patient's allergies indicates no known allergies.  Family History  Problem Relation Age of Onset  . Cancer Mother     lung cancer non smoker    Social History Social History  Substance Use Topics  . Smoking status: Never Smoker   . Smokeless tobacco: Current User  . Alcohol Use: 0.0 oz/week    0 Standard drinks or equivalent per week     Comment: occ    Review of Systems Constitutional: No fever/chills Eyes: No visual changes. ENT: No sore throat. Cardiovascular: Denies chest pain. Respiratory: Denies shortness of breath. Gastrointestinal: +abdominal pain.  Vomiting x 4.  One loose stool.  No constipation. Genitourinary: Negative for dysuria. Musculoskeletal: Negative for back pain. Skin: Negative for rash. Neurological: Negative for headaches, focal weakness or numbness.  10-point ROS otherwise negative.  ____________________________________________   PHYSICAL EXAM:  VITAL SIGNS: ED Triage Vitals  Enc Vitals Group     BP 09/22/15 2357 130/78 mmHg     Pulse Rate 09/22/15 2357 56  Resp 09/22/15 2357 24     Temp 09/22/15 2357 97.5 F (36.4 C)     Temp Source 09/22/15 2357 Oral     SpO2 09/22/15 2357 100 %     Weight 09/22/15 2357 210 lb (95.255 kg)     Height 09/22/15 2357  (1.93 m)     Head Cir --      Peak Flow --      Pain Score 09/22/15 2358 6     Pain Loc --      Pain Edu? --      Excl. in GC? --     Constitutional: Alert and oriented. Appears uncomfortable but nontoxic  Eyes: Conjunctivae are normal. PERRL. EOMI. Head: Atraumatic. Nose: No congestion/rhinnorhea. Mouth/Throat: Mucous membranes are moist.  Oropharynx  non-erythematous. Neck: No stridor.  No meningeal signs.   Cardiovascular: Normal rate, regular rhythm. Good peripheral circulation. Grossly normal heart sounds.   Respiratory: Normal respiratory effort.  No retractions. Lungs CTAB. Gastrointestinal: Soft with tenderness to palpation of the epigastrium and right upper quadrant.  No lower abdominal tenderness.  No rebound nor guarding. Musculoskeletal: No lower extremity tenderness nor edema. No gross deformities of extremities. Neurologic:  Normal speech and language. No gross focal neurologic deficits are appreciated.  Skin:  Skin is warm, dry and intact. No rash noted. Psychiatric: Mood and affect are normal. Speech and behavior are normal.  ____________________________________________   LABS (all labs ordered are listed, but only abnormal results are displayed)  Labs Reviewed  COMPREHENSIVE METABOLIC PANEL - Abnormal; Notable for the following:    Glucose, Bld 120 (*)    Total Protein 8.2 (*)    All other components within normal limits  CBC - Abnormal; Notable for the following:    WBC 13.6 (*)    All other components within normal limits  URINALYSIS COMPLETEWITH MICROSCOPIC ([facility] ONLY) - Abnormal; Notable for the following:    Color, Urine YELLOW (*)    APPearance HAZY (*)    Ketones, ur TRACE (*)    All other components within normal limits  LIPASE, BLOOD   ____________________________________________  EKG  ED ECG REPORT I, Manson Luckadoo, the attending physician, personally viewed and interpreted this ECG.  Date: 09/23/2015 EKG Time: 00:16 Rate: 52 Rhythm: Sinus bradycardia QRS Axis: normal Intervals: normal ST/T Wave abnormalities: normal Conduction Disturbances: none Narrative Interpretation: unremarkable  ____________________________________________  RADIOLOGY   US Abdomen Limited Ruq  09/23/2015  CLINICAL DATA:  Epigastric pain and vomiting for 5 hours. Leukocytosis. EXAM: US ABDOMEN LIMITED - RIGHT  UPPER QUADRANT COMPARISON:  None. FINDINGS: Gallbladder: 1.7 cm stone appears to be impacted in the gallbladder neck. Moderate intraluminal sludge. No gallbladder wall thickening or pericholecystic fluid. The patient was medicated and we were unable to assess for a Murphy's sign. Common bile duct: Diameter: 8.5 mm. Liver: Mildly increased echogenicity of hepatic parenchyma without focal lesion. This may represent fatty infiltration. IMPRESSION: 1. Cholelithiasis. Mild extrahepatic bile duct dilatation. Cannot exclude acute cholecystitis. 2. Mildly increased hepatic echogenicity suggesting fatty infiltration. Electronically Signed   By: Ellery Plunk M.D.   On: 09/23/2015 02:18    ____________________________________________   PROCEDURES  Procedure(s) performed:   Procedures   ____________________________________________   INITIAL IMPRESSION / ASSESSMENT AND PLAN / ED COURSE  Pertinent labs & imaging results that were available during my care of the patient were reviewed by me and considered in my medical decision making (see chart for details).  The patient's Pain is well controlled after 1  dose of morphine.  He has only minor tenderness to palpation of the right upper quadrant after his ultrasound results came back.  He has a slight leukocytosis which might be stress related.  His labs are otherwise normal including all of his LFTs and T bili.  We had an extensive discussion about his U/S results and whether or not to contact surgery, including the risks and consultations associated with cholelithiasis should it progress to choledocholithiasis or cholecystitis.  Since his pain is well controlled, he prefers to follow up as an outpatient.  I gave extensive return precautions and encouraged him to call as soon as possible on Monday to schedule the next available outpatient appointment.  He and his wife both agree that they will come right back to the hospital should his symptoms worsen or if  he develops any new concerning symptoms such as fever, intractable vomiting, worsening abdominal pain, jaundice, scleral icterus, etc.   ____________________________________________  FINAL CLINICAL IMPRESSION(S) / ED DIAGNOSES  Final diagnoses:  Cholelithiasis without cholecystitis     MEDICATIONS GIVEN DURING THIS VISIT:  Medications  oxyCODONE-acetaminophen (PERCOCET/ROXICET) 5-325 MG per tablet 2 tablet (not administered)  morphine 4 MG/ML injection 4 mg (4 mg Intravenous Given 09/23/15 0050)  ondansetron (ZOFRAN) injection 4 mg (4 mg Intravenous Given 09/23/15 0049)     NEW OUTPATIENT MEDICATIONS STARTED DURING THIS VISIT:  New Prescriptions   DOCUSATE SODIUM (COLACE) 100 MG CAPSULE    Take 1 tablet once or twice daily as needed for constipation while taking narcotic pain medicine   ONDANSETRON (ZOFRAN ODT) 4 MG DISINTEGRATING TABLET    Allow 1-2 tablets to dissolve in your mouth every 8 hours as needed for nausea/vomiting   OXYCODONE-ACETAMINOPHEN (ROXICET) 5-325 MG TABLET    Take 1-2 tablets by mouth every 4 (four) hours as needed for severe pain.      Note:  This document was prepared using Dragon voice recognition software and may include unintentional dictation errors.   Loleta Roseory Myka Hitz, MD 09/23/15 [id number]  Loleta Roseory Yashar Inclan, MD 09/23/15 [phone]

## 2015-09-24 ENCOUNTER — Encounter: Payer: Self-pay | Admitting: *Deleted

## 2015-09-24 ENCOUNTER — Emergency Department: Payer: BLUE CROSS/BLUE SHIELD

## 2015-09-24 ENCOUNTER — Observation Stay
Admission: EM | Admit: 2015-09-24 | Discharge: 2015-09-27 | Disposition: A | Discharge status: Home / Self Care | Payer: BLUE CROSS/BLUE SHIELD | Attending: General Surgery | Admitting: General Surgery

## 2015-09-24 DIAGNOSIS — Y939 Activity, unspecified: Secondary | ICD-10-CM | POA: Diagnosis not present

## 2015-09-24 DIAGNOSIS — K8000 Calculus of gallbladder with acute cholecystitis without obstruction: Secondary | ICD-10-CM | POA: Diagnosis not present

## 2015-09-24 DIAGNOSIS — R1011 Right upper quadrant pain: Secondary | ICD-10-CM

## 2015-09-24 DIAGNOSIS — F410 Panic disorder [episodic paroxysmal anxiety] without agoraphobia: Secondary | ICD-10-CM | POA: Insufficient documentation

## 2015-09-24 DIAGNOSIS — K810 Acute cholecystitis: Secondary | ICD-10-CM | POA: Diagnosis present

## 2015-09-24 DIAGNOSIS — Z79899 Other long term (current) drug therapy: Secondary | ICD-10-CM | POA: Insufficient documentation

## 2015-09-24 DIAGNOSIS — M19079 Primary osteoarthritis, unspecified ankle and foot: Secondary | ICD-10-CM | POA: Diagnosis not present

## 2015-09-24 DIAGNOSIS — L03032 Cellulitis of left toe: Secondary | ICD-10-CM | POA: Insufficient documentation

## 2015-09-24 DIAGNOSIS — Z809 Family history of malignant neoplasm, unspecified: Secondary | ICD-10-CM | POA: Insufficient documentation

## 2015-09-24 DIAGNOSIS — X58XXXA Exposure to other specified factors, initial encounter: Secondary | ICD-10-CM | POA: Insufficient documentation

## 2015-09-24 DIAGNOSIS — H6992 Unspecified Eustachian tube disorder, left ear: Secondary | ICD-10-CM | POA: Diagnosis not present

## 2015-09-24 DIAGNOSIS — Z9660 Presence of unspecified orthopedic joint implant: Secondary | ICD-10-CM | POA: Insufficient documentation

## 2015-09-24 DIAGNOSIS — S39012A Strain of muscle, fascia and tendon of lower back, initial encounter: Secondary | ICD-10-CM | POA: Diagnosis not present

## 2015-09-24 DIAGNOSIS — R109 Unspecified abdominal pain: Secondary | ICD-10-CM | POA: Diagnosis present

## 2015-09-24 HISTORY — DX: Acute cholecystitis: K81.0

## 2015-09-24 LAB — CBC WITH DIFFERENTIAL/PLATELET
BASOS ABS: 0 10*3/uL (ref 0–0.1)
BASOS PCT: 0 %
Eosinophils Absolute: 0 10*3/uL (ref 0–0.7)
Eosinophils Relative: 0 %
HEMATOCRIT: 41 % (ref 40.0–52.0)
Hemoglobin: 14.2 g/dL (ref 13.0–18.0)
LYMPHS PCT: 4 %
Lymphs Abs: 0.7 10*3/uL — ABNORMAL LOW (ref 1.0–3.6)
MCH: 30.6 pg (ref 26.0–34.0)
MCHC: 34.6 g/dL (ref 32.0–36.0)
MCV: 88.4 fL (ref 80.0–100.0)
Monocytes Absolute: 1.3 10*3/uL — ABNORMAL HIGH (ref 0.2–1.0)
Monocytes Relative: 7 %
NEUTROS ABS: 16.6 10*3/uL — AB (ref 1.4–6.5)
NEUTROS PCT: 89 %
Platelets: 261 10*3/uL (ref 150–440)
RBC: 4.64 MIL/uL (ref 4.40–5.90)
RDW: 12.9 % (ref 11.5–14.5)
WBC: 18.7 10*3/uL — AB (ref 3.8–10.6)

## 2015-09-24 LAB — COMPREHENSIVE METABOLIC PANEL
ALBUMIN: 3.9 g/dL (ref 3.5–5.0)
ALT: 24 U/L (ref 17–63)
ANION GAP: 8 (ref 5–15)
AST: 26 U/L (ref 15–41)
Alkaline Phosphatase: 74 U/L (ref 38–126)
BILIRUBIN TOTAL: 1.1 mg/dL (ref 0.3–1.2)
BUN: 10 mg/dL (ref 6–20)
CALCIUM: 8.8 mg/dL — AB (ref 8.9–10.3)
CO2: 24 mmol/L (ref 22–32)
Chloride: 101 mmol/L (ref 101–111)
Creatinine, Ser: 0.93 mg/dL (ref 0.61–1.24)
Glucose, Bld: 132 mg/dL — ABNORMAL HIGH (ref 65–99)
POTASSIUM: 3.6 mmol/L (ref 3.5–5.1)
Sodium: 133 mmol/L — ABNORMAL LOW (ref 135–145)
Total Protein: 7.8 g/dL (ref 6.5–8.1)

## 2015-09-24 LAB — LIPASE, BLOOD: Lipase: 23 U/L (ref 11–51)

## 2015-09-24 MED ORDER — ONDANSETRON HCL 4 MG/2ML IJ SOLN
4.0000 mg | Freq: Once | INTRAMUSCULAR | Status: AC
  Administered 2015-09-24: 4 mg via INTRAVENOUS

## 2015-09-24 MED ORDER — MORPHINE SULFATE (PF) 2 MG/ML IV SOLN
1.0000 mg | INTRAVENOUS | Status: DC | PRN
  Administered 2015-09-24 – 2015-09-25 (×4): 1 mg via INTRAVENOUS
  Filled 2015-09-24 (×4): qty 1

## 2015-09-24 MED ORDER — SODIUM CHLORIDE 0.9 % IV BOLUS (SEPSIS)
1000.0000 mL | Freq: Once | INTRAVENOUS | Status: AC
  Administered 2015-09-24: 1000 mL via INTRAVENOUS

## 2015-09-24 MED ORDER — LACTATED RINGERS IV SOLN
INTRAVENOUS | Status: DC
  Administered 2015-09-24 – 2015-09-25 (×5): via INTRAVENOUS

## 2015-09-24 MED ORDER — ONDANSETRON 8 MG PO TBDP: 4.0000 mg | ORAL_TABLET | Freq: Four times a day (QID) | ORAL | Status: DC | PRN

## 2015-09-24 MED ORDER — PIPERACILLIN-TAZOBACTAM 3.375 G IVPB
3.3750 g | Freq: Three times a day (TID) | INTRAVENOUS | Status: DC
  Administered 2015-09-24 – 2015-09-27 (×10): 3.375 g via INTRAVENOUS
  Filled 2015-09-24 (×12): qty 50

## 2015-09-24 MED ORDER — ONDANSETRON HCL 4 MG/2ML IJ SOLN
INTRAMUSCULAR | Status: AC
  Administered 2015-09-24: 4 mg via INTRAVENOUS
  Filled 2015-09-24: qty 2

## 2015-09-24 MED ORDER — HYDROCODONE-ACETAMINOPHEN 5-325 MG PO TABS
1.0000 | ORAL_TABLET | ORAL | Status: DC | PRN
  Administered 2015-09-24 (×2): 2 via ORAL
  Filled 2015-09-24 (×2): qty 2

## 2015-09-24 MED ORDER — ONDANSETRON HCL 4 MG/2ML IJ SOLN
4.0000 mg | Freq: Four times a day (QID) | INTRAMUSCULAR | Status: DC | PRN
  Administered 2015-09-25: 4 mg via INTRAVENOUS

## 2015-09-24 MED ORDER — HYDROMORPHONE HCL 1 MG/ML IJ SOLN
INTRAMUSCULAR | Status: AC
  Administered 2015-09-24: 0.5 mg via INTRAVENOUS
  Filled 2015-09-24: qty 1

## 2015-09-24 MED ORDER — HYDROMORPHONE HCL 1 MG/ML IJ SOLN
0.5000 mg | Freq: Once | INTRAMUSCULAR | Status: AC
  Administered 2015-09-24: 0.5 mg via INTRAVENOUS

## 2015-09-24 NOTE — ED Provider Notes (Signed)
[facility] Emergency Department Provider Note   ____________________________________________  Time seen: Approximately 6:28 AM  I have reviewed the triage vital signs and the nursing notes.   HISTORY  Chief Complaint Abdominal Pain    HPI Austin Nguyen is a 45 y.o. male patient was here very recently with exactly the same kind of pain was diagnosed with gallstones had a stone impacted in the neck of the gallbladder with some sludge. Patient's pain was very easily controlled with 4 mg of morphine he went home for outpatient follow-up. Patient's pain returned tonight oral pain medicine did not control it so he returned here. Patient is not having any nausea vomiting fever or diarrhea just the pain. Again this pain is exactly the same as what he had with his recent tach and gallbladder pain.   History reviewed. No pertinent past medical history.  Patient Active Problem List   Diagnosis Date Noted  . Cellulitis of toe of left foot 04/14/2015  . Cellulitis 10/17/2014  . Panic disorder 09/21/2012  . Arthritis of ankle or foot, degenerative 09/05/2012  . Lumbar strain 04/21/2012  . Right ankle pain 10/30/2011  . Chewing tobacco use 10/30/2011  . HEARTBURN 12/19/2009  . Back pain with left-sided radiculopathy 09/22/2008  . PANIC DISORDER 08/31/2008  . EUSTACHIAN TUBE DYSFUNCTION, LEFT 08/24/2008  . SHOULDER PAIN 10/15/2007    Past Surgical History  Procedure Laterality Date  . Joint replacement      Current Outpatient Rx  Name  Route  Sig  Dispense  Refill  . docusate sodium (COLACE) 100 MG capsule      Take 1 tablet once or twice daily as needed for constipation while taking narcotic pain medicine   30 capsule   0   . levofloxacin (LEVAQUIN) 500 MG tablet   Oral   Take 1 tablet (500 mg total) by mouth daily. Take with food   10 tablet   0   . ondansetron (ZOFRAN ODT) 4 MG disintegrating tablet      Allow 1-2 tablets to dissolve in  your mouth every 8 hours as needed for nausea/vomiting   30 tablet   0   . oxyCODONE-acetaminophen (ROXICET) 5-325 MG tablet   Oral   Take 1-2 tablets by mouth every 4 (four) hours as needed for severe pain.   30 tablet   0     Allergies Review of patient's allergies indicates no known allergies.  Family History  Problem Relation Age of Onset  . Cancer Mother     lung cancer non smoker    Social History Social History  Substance Use Topics  . Smoking status: Never Smoker   . Smokeless tobacco: Current User  . Alcohol Use: 0.0 oz/week    0 Standard drinks or equivalent per week     Comment: occ    Review of Systems Constitutional: No fever/chills Eyes: No visual changes. ENT: No sore throat. Cardiovascular: Denies chest pain. Respiratory: Denies shortness of breath. Gastrointestinal:See history of present illness Genitourinary: Negative for dysuria. Musculoskeletal: Negative for back pain. Skin: Negative for rash. Neurological: Negative for headaches, focal weakness or numbness.  10-point ROS otherwise negative.  ____________________________________________   PHYSICAL EXAM:  VITAL SIGNS: ED Triage Vitals  Enc Vitals Group     BP 09/24/15 0611 115/74 mmHg     Pulse Rate 09/24/15 0611 92     Resp 09/24/15 0611 20     Temp 09/24/15 0611 98.5 F (36.9 C)  Temp Source 09/24/15 0611 Oral     SpO2 09/24/15 0611 94 %     Weight 09/24/15 0611 210 lb (95.255 kg)     Height 09/24/15 0611 6\' 1"  (1.854 m)     Head Cir --      Peak Flow --      Pain Score 09/24/15 0613 6     Pain Loc --      Pain Edu? --      Excl. in GC? --     Constitutional: Alert and oriented. Well appearing and in no acute distress. Eyes: Conjunctivae are normal. PERRL. EOMI. Head: Atraumatic. Nose: No congestion/rhinnorhea. Mouth/Throat: Mucous membranes are moist.  Oropharynx non-erythematous. Neck: No stridor.   Cardiovascular: Normal rate, regular rhythm. Grossly normal heart  sounds.  Good peripheral circulation. Respiratory: Normal respiratory effort.  No retractions. Lungs CTAB. Gastrointestinal: Soft and nontender except mildly in the right upper quadrant and epigastric area. There is minimal tenderness to percussion.. No distention. No abdominal bruits. No CVA tenderness. Musculoskeletal: No lower extremity tenderness nor edema.  No joint effusions. Neurologic:  Normal speech and language. No gross focal neurologic deficits are appreciated. No gait instability. Skin:  Skin is warm, dry and intact. No rash noted. Psychiatric: Mood and affect are normal. Speech and behavior are normal.  ____________________________________________   LABS (all labs ordered are listed, but only abnormal results are displayed)  Labs Reviewed  COMPREHENSIVE METABOLIC PANEL - Abnormal; Notable for the following:    Sodium 133 (*)    Glucose, Bld 132 (*)    Calcium 8.8 (*)    All other components within normal limits  CBC WITH DIFFERENTIAL/PLATELET - Abnormal; Notable for the following:    WBC 18.7 (*)    Neutro Abs 16.6 (*)    Lymphs Abs 0.7 (*)    Monocytes Absolute 1.3 (*)    All other components within normal limits  LIPASE, BLOOD   ____________________________________________  EKG  EKG read and interpreted by me shows normal sinus rhythm rate of 79 normal axis and slight PR segment depression in aVF otherwise looks okay ____________________________________________  RADIOLOGY   ____________________________________________   PROCEDURES   ____________________________________________   INITIAL IMPRESSION / ASSESSMENT AND PLAN / ED COURSE  Pertinent labs & imaging results that were available during my care of the patient were reviewed by me and considered in my medical decision making (see chart for details).  Surgery asked to repeat ultrasound I will order the test follow-up and call surgeon when it is done. Patient's white blood count has gone  up. ____________________________________________   FINAL CLINICAL IMPRESSION(S) / ED DIAGNOSES  Final diagnoses:  Right upper quadrant pain      NEW MEDICATIONS STARTED DURING THIS VISIT:  New Prescriptions   No medications on file     Note:  This document was prepared using Dragon voice recognition software and may include unintentional dictation errors.    Arnaldo NatalPaul F Kunal Levario, MD 09/24/15 [phone]

## 2015-09-24 NOTE — ED Notes (Signed)
Patient noted to be experiencing oxygen desaturation s/p Dilaudid 0.5mg  IVP. Patient placed on supplemental oxygen at 2L/[place]. Oxygen saturations improved to 97%. Patient reports that pain is well controlled with interventions provided thus far; pain 3/10. Will continue to monitor.

## 2015-09-24 NOTE — ED Notes (Signed)
Pt woken from sleep by abdominal pain. Seen for same on past Friday and diagnosed w/ gallstones. Pt last took nausea meds at 0500 and 2 oxycodone at 0400. Pt denies nausea but endorses pain uncontrolled by meds taken this morning.

## 2015-09-24 NOTE — H&P (Signed)
Patient ID: Austin Nguyen, male   DOB: December 06, 1970, 45 y.o.   MRN: [id number]  History of Present Illness Austin Nguyen is a 45 y.o. male with 3 day history of worsening RUQ pain.  He was seen here in the ED on 6/30 and found to have symptomatic cholelithiasis, his symptoms improved and he elected to follow up as an outpatient for surgical evaluation.  However, though his symptoms remained mild on 7/1, early this AM they became significantly more severe, associated with nausea and fevers, and an increase in pain.  He therefore returned here to the ED.    Past Medical History History reviewed. No pertinent past medical history.     Past Surgical History  Procedure Laterality Date  . Joint replacement      No Known Allergies  No current facility-administered medications for this encounter.   Current Outpatient Prescriptions  Medication Sig Dispense Refill  . docusate sodium (COLACE) 100 MG capsule Take 1 tablet once or twice daily as needed for constipation while taking narcotic pain medicine 30 capsule 0  . levofloxacin (LEVAQUIN) 500 MG tablet Take 1 tablet (500 mg total) by mouth daily. Take with food 10 tablet 0  . ondansetron (ZOFRAN ODT) 4 MG disintegrating tablet Allow 1-2 tablets to dissolve in your mouth every 8 hours as needed for nausea/vomiting 30 tablet 0  . oxyCODONE-acetaminophen (ROXICET) 5-325 MG tablet Take 1-2 tablets by mouth every 4 (four) hours as needed for severe pain. 30 tablet 0    Family History Family History  Problem Relation Age of Onset  . Cancer Mother     lung cancer non smoker       Social History Social History  Substance Use Topics  . Smoking status: Never Smoker   . Smokeless tobacco: Current User  . Alcohol Use: 0.0 oz/week    0 Standard drinks or equivalent per week     Comment: occ        ROS As per HPI, otherwise 12 pt ROS wnl   Physical Exam Blood pressure 109/69, pulse 77, temperature 98.5 F (36.9 C), temperature source  Oral, resp. rate 29, height 6\' 1"  (1.854 m), weight 210 lb (95.255 kg), SpO2 95 %.  CONSTITUTIONAL: alert and oriented, NAD EYES: Pupils equal, round, and reactive to light, Sclera non-icteric. EARS, NOSE, MOUTH AND THROAT: The oropharynx is clear. Oral mucosa is pink and moist. Hearing is intact to voice.  NECK: Trachea is midline, and there is no jugular venous distension. Thyroid is without palpable abnormalities. LYMPH NODES:  Lymph nodes in the neck are not enlarged. RESPIRATORY:  . Normal respiratory effort without pathologic use of accessory muscles. CARDIOVASCULAR: Heart is regular without murmurs, gallops, or rubs. GI: The abdomen is  soft, mod tender RUQ, +Murphys sign, and nondistended.  GU: deferred MUSCULOSKELETAL:  Normal muscle strength and tone in all four extremities.    SKIN: Skin turgor is normal. There are no pathologic skin lesions.  NEUROLOGIC:  Motor and sensation is grossly normal.  Cranial nerves are grossly intact. PSYCH:  Alert and oriented to person, place and time. Affect is normal.  Data Reviewed WBC: 18.7   I have personally reviewed the patient's imaging and medical records.    Assessment    36M with likely acute cholecystitis  Plan    In setting of increasing pain, now with increased WBC, this likely represents acute cholecystitis, will repeat US to evaluate.  Will plan for admission, NPO, IVF, IV abx, and planned lap  chole.  Risks, benefits and alternatives were d/w patient and his wife.  Face-to-face time spent with the patient and care providers was 30 minutes, with more than 50% of the time spent counseling, educating, and coordinating care of the patient.     [place]had Elise Knobloch 09/24/2015, 7:23 AM

## 2015-09-24 NOTE — ED Notes (Signed)
Pt back from ultrasound.

## 2015-09-25 ENCOUNTER — Encounter
Admission: EM | Disposition: A | Discharge status: Home / Self Care | Payer: Self-pay | Source: Home / Self Care | Attending: Emergency Medicine

## 2015-09-25 ENCOUNTER — Observation Stay: Payer: BLUE CROSS/BLUE SHIELD | Admitting: Anesthesiology

## 2015-09-25 ENCOUNTER — Encounter: Payer: Self-pay | Admitting: *Deleted

## 2015-09-25 DIAGNOSIS — K810 Acute cholecystitis: Secondary | ICD-10-CM | POA: Diagnosis not present

## 2015-09-25 DIAGNOSIS — R1011 Right upper quadrant pain: Secondary | ICD-10-CM | POA: Diagnosis not present

## 2015-09-25 DIAGNOSIS — R109 Unspecified abdominal pain: Secondary | ICD-10-CM | POA: Insufficient documentation

## 2015-09-25 HISTORY — PX: CHOLECYSTECTOMY: SHX55

## 2015-09-25 LAB — CBC
HEMATOCRIT: 39.4 % — AB (ref 40.0–52.0)
HEMOGLOBIN: 13.5 g/dL (ref 13.0–18.0)
MCH: 30.7 pg (ref 26.0–34.0)
MCHC: 34.3 g/dL (ref 32.0–36.0)
MCV: 89.6 fL (ref 80.0–100.0)
Platelets: 213 10*3/uL (ref 150–440)
RBC: 4.4 MIL/uL (ref 4.40–5.90)
RDW: 13.1 % (ref 11.5–14.5)
WBC: 20.4 10*3/uL — AB (ref 3.8–10.6)

## 2015-09-25 LAB — COMPREHENSIVE METABOLIC PANEL
ALBUMIN: 3.5 g/dL (ref 3.5–5.0)
ALT: 21 U/L (ref 17–63)
ANION GAP: 7 (ref 5–15)
AST: 20 U/L (ref 15–41)
Alkaline Phosphatase: 69 U/L (ref 38–126)
BILIRUBIN TOTAL: 1.3 mg/dL — AB (ref 0.3–1.2)
BUN: 9 mg/dL (ref 6–20)
CHLORIDE: 101 mmol/L (ref 101–111)
CO2: 26 mmol/L (ref 22–32)
Calcium: 8.6 mg/dL — ABNORMAL LOW (ref 8.9–10.3)
Creatinine, Ser: 0.75 mg/dL (ref 0.61–1.24)
GFR calc Af Amer: >60 mL/min (ref >60)
GLUCOSE: 106 mg/dL — AB (ref 65–99)
POTASSIUM: 4.1 mmol/L (ref 3.5–5.1)
Sodium: 134 mmol/L — ABNORMAL LOW (ref 135–145)
TOTAL PROTEIN: 7.5 g/dL (ref 6.5–8.1)

## 2015-09-25 LAB — MRSA PCR SCREENING: MRSA BY PCR: NEGATIVE

## 2015-09-25 SURGERY — LAPAROSCOPIC CHOLECYSTECTOMY
Anesthesia: General | Wound class: Contaminated

## 2015-09-25 MED ORDER — PROPOFOL 10 MG/ML IV BOLUS
INTRAVENOUS | Status: DC | PRN
  Administered 2015-09-25: 200 mg via INTRAVENOUS

## 2015-09-25 MED ORDER — ONDANSETRON HCL 4 MG/2ML IJ SOLN: 4.0000 mg | Freq: Once | INTRAMUSCULAR | Status: DC | PRN

## 2015-09-25 MED ORDER — ACETAMINOPHEN 10 MG/ML IV SOLN
INTRAVENOUS | Status: DC | PRN
  Administered 2015-09-25: 1000 mg via INTRAVENOUS

## 2015-09-25 MED ORDER — DEXAMETHASONE SODIUM PHOSPHATE 10 MG/ML IJ SOLN
INTRAMUSCULAR | Status: DC | PRN
  Administered 2015-09-25: 5 mg via INTRAVENOUS

## 2015-09-25 MED ORDER — LIDOCAINE HCL (CARDIAC) 20 MG/ML IV SOLN
INTRAVENOUS | Status: DC | PRN
  Administered 2015-09-25: 100 mg via INTRAVENOUS

## 2015-09-25 MED ORDER — FENTANYL CITRATE (PF) 100 MCG/2ML IJ SOLN: 25.0000 ug | INTRAMUSCULAR | Status: DC | PRN

## 2015-09-25 MED ORDER — FAMOTIDINE 20 MG PO TABS
20.0000 mg | ORAL_TABLET | Freq: Once | ORAL | Status: AC
  Administered 2015-09-25: 20 mg via ORAL

## 2015-09-25 MED ORDER — ACETAMINOPHEN 10 MG/ML IV SOLN
INTRAVENOUS | Status: AC
  Filled 2015-09-25: qty 100

## 2015-09-25 MED ORDER — GLYCOPYRROLATE 0.2 MG/ML IJ SOLN
INTRAMUSCULAR | Status: DC | PRN
  Administered 2015-09-25: 0.2 mg via INTRAVENOUS

## 2015-09-25 MED ORDER — MIDAZOLAM HCL 2 MG/2ML IJ SOLN
INTRAMUSCULAR | Status: DC | PRN
  Administered 2015-09-25: 2 mg via INTRAVENOUS

## 2015-09-25 MED ORDER — BUPIVACAINE-EPINEPHRINE (PF) 0.25% -1:200000 IJ SOLN
INTRAMUSCULAR | Status: DC | PRN
  Administered 2015-09-25: 30 mL

## 2015-09-25 MED ORDER — FENTANYL CITRATE (PF) 100 MCG/2ML IJ SOLN
INTRAMUSCULAR | Status: DC | PRN
  Administered 2015-09-25: 100 ug via INTRAVENOUS
  Administered 2015-09-25 (×2): 50 ug via INTRAVENOUS

## 2015-09-25 MED ORDER — SUGAMMADEX SODIUM 200 MG/2ML IV SOLN
INTRAVENOUS | Status: DC | PRN
  Administered 2015-09-25: 200 mg via INTRAVENOUS

## 2015-09-25 MED ORDER — BUPIVACAINE-EPINEPHRINE (PF) 0.25% -1:200000 IJ SOLN
INTRAMUSCULAR | Status: AC
  Filled 2015-09-25: qty 30

## 2015-09-25 MED ORDER — KETOROLAC TROMETHAMINE 30 MG/ML IJ SOLN
INTRAMUSCULAR | Status: DC | PRN
  Administered 2015-09-25: 30 mg via INTRAVENOUS

## 2015-09-25 MED ORDER — ROCURONIUM BROMIDE 100 MG/10ML IV SOLN
INTRAVENOUS | Status: DC | PRN
  Administered 2015-09-25: 10 mg via INTRAVENOUS
  Administered 2015-09-25: 40 mg via INTRAVENOUS

## 2015-09-25 SURGICAL SUPPLY — 47 items
ADHESIVE MASTISOL STRL (MISCELLANEOUS) ×3 IMPLANT
APPLIER CLIP ROT 10 11.4 M/L (STAPLE) ×3
BLADE SURG SZ11 CARB STEEL (BLADE) ×3 IMPLANT
CANISTER SUCT 1200ML W/VALVE (MISCELLANEOUS) ×3 IMPLANT
CANISTER SUCT 3000ML (MISCELLANEOUS) ×3 IMPLANT
CATH CHOLANGI 4FR 420404F (CATHETERS) IMPLANT
CHLORAPREP W/TINT 26ML (MISCELLANEOUS) ×3 IMPLANT
CLIP APPLIE ROT 10 11.4 M/L (STAPLE) ×1 IMPLANT
CLOSURE WOUND 1/2 X4 (GAUZE/BANDAGES/DRESSINGS) ×1
CONRAY 60ML FOR OR (MISCELLANEOUS) IMPLANT
DRAPE C-ARM XRAY 36X54 (DRAPES) IMPLANT
ELECT REM PT RETURN 9FT ADLT (ELECTROSURGICAL) ×3
ELECTRODE REM PT RTRN 9FT ADLT (ELECTROSURGICAL) ×1 IMPLANT
ENDOPOUCH RETRIEVER 10 (MISCELLANEOUS) ×3 IMPLANT
GAUZE SPONGE NON-WVN 2X2 STRL (MISCELLANEOUS) ×4 IMPLANT
GLOVE BIO SURGEON STRL SZ8 (GLOVE) ×18 IMPLANT
GOWN STRL REUS W/ TWL LRG LVL3 (GOWN DISPOSABLE) ×3 IMPLANT
GOWN STRL REUS W/TWL LRG LVL3 (GOWN DISPOSABLE) ×6
HEMOSTAT SURGICEL 2X3 (HEMOSTASIS) ×6 IMPLANT
IRRIGATION STRYKERFLOW (MISCELLANEOUS) IMPLANT
IRRIGATOR STRYKERFLOW (MISCELLANEOUS)
IV CATH ANGIO 12GX3 LT BLUE (NEEDLE) ×3 IMPLANT
IV NS 1000ML (IV SOLUTION) ×4
IV NS 1000ML BAXH (IV SOLUTION) ×2 IMPLANT
JACKSON PRATT 10 (INSTRUMENTS) ×3 IMPLANT
KIT RM TURNOVER STRD PROC AR (KITS) ×3 IMPLANT
LABEL OR SOLS (LABEL) ×3 IMPLANT
NDL SAFETY 22GX1.5 (NEEDLE) ×3 IMPLANT
NEEDLE VERESS 14GA 120MM (NEEDLE) ×3 IMPLANT
NS IRRIG 500ML POUR BTL (IV SOLUTION) ×3 IMPLANT
PACK LAP CHOLECYSTECTOMY (MISCELLANEOUS) ×3 IMPLANT
SCISSORS METZENBAUM CVD 33 (INSTRUMENTS) ×3 IMPLANT
SLEEVE ENDOPATH XCEL 5M (ENDOMECHANICALS) ×6 IMPLANT
SPONGE EXCIL AMD DRAIN 4X4 6P (MISCELLANEOUS) ×3 IMPLANT
SPONGE LAP 18X18 5 PK (GAUZE/BANDAGES/DRESSINGS) ×3 IMPLANT
SPONGE VERSALON 2X2 STRL (MISCELLANEOUS) ×8
STRIP CLOSURE SKIN 1/2X4 (GAUZE/BANDAGES/DRESSINGS) ×2 IMPLANT
SUT ETHILON 3-0 FS-10 30 BLK (SUTURE) ×3
SUT MNCRL 4-0 (SUTURE) ×2
SUT MNCRL 4-0 27XMFL (SUTURE) ×1
SUT VICRYL 0 AB UR-6 (SUTURE) ×3 IMPLANT
SUTURE EHLN 3-0 FS-10 30 BLK (SUTURE) ×1 IMPLANT
SUTURE MNCRL 4-0 27XMF (SUTURE) ×1 IMPLANT
SYR 20CC LL (SYRINGE) ×3 IMPLANT
TROCAR XCEL NON-BLD 11X100MML (ENDOMECHANICALS) ×3 IMPLANT
TROCAR XCEL NON-BLD 5MMX100MML (ENDOMECHANICALS) ×3 IMPLANT
TUBING INSUFFLATOR HI FLOW (MISCELLANEOUS) ×3 IMPLANT

## 2015-09-25 NOTE — Op Note (Signed)
Laparoscopic Cholecystectomy  Pre-operative Diagnosis: Acute cholecystitis  Post-operative Diagnosis: Acute cholecystitis with hydrops  Procedure: Laparoscopic cholecystectomy  Surgeon: Adah Salvageichard E. Excell Seltzerooper, MD FACS  Anesthesia: Gen. with endotracheal tube  Assistant: Surgical tech  Procedure Details  The patient was seen again in the Holding Room. The benefits, complications, treatment options, and expected outcomes were discussed with the patient. The risks of bleeding, infection, recurrence of symptoms, failure to resolve symptoms, bile duct damage, bile duct leak, retained common bile duct stone, bowel injury, any of which could require further surgery and/or ERCP, stent, or papillotomy were reviewed with the patient. The likelihood of improving the patient's symptoms with return to their baseline status is good.  The patient and/or family concurred with the proposed plan, giving informed consent.  The patient was taken to Operating Room, identified as Austin Nguyen and the procedure verified as Laparoscopic Cholecystectomy.  A Time Out was held and the above information confirmed.  Prior to the induction of general anesthesia, antibiotic prophylaxis was administered. VTE prophylaxis was in place. General endotracheal anesthesia was then administered and tolerated well. After the induction, the abdomen was prepped with Chloraprep and draped in the sterile fashion. The patient was positioned in the supine position.  Local anesthetic  was injected into the skin near the umbilicus and an incision made. The Veress needle was placed. Pneumoperitoneum was then created with CO2 and tolerated well without any adverse changes in the patient's vital signs. A 5mm port was placed in the periumbilical position and the abdominal cavity was explored.  Two 5-mm ports were placed in the right upper quadrant and a 12 mm epigastric port was placed all under direct vision. All skin incisions  were infiltrated  with a local anesthetic agent before making the incision and placing the trocars.   The patient was positioned  in reverse Trendelenburg, tilted slightly to the patient's left.  The gallbladder was identified, the fundus grasped and retracted cephalad. Adhesions were lysed bluntly. The infundibulum was grasped and retracted laterally, exposing the peritoneum overlying the triangle of Calot. Changing to an angled scope aided in this view. This was then divided and exposed in a blunt fashion. A critical view of the cystic duct and cystic artery was obtained.  The cystic duct was clearly identified and bluntly dissected.   The infundibulum of the gallbladder possessed a very large stone impacted in the infundibulum. The cystic duct was doubly clipped and divided and branches of the cystic artery were doubly clipped and divided as well and lateral branch was divided after clipping as well. The gallbladder was removed from the gallbladder fossa with blunt dissection and electrocautery dissection and gangrenous material was noted in the gallbladder fossa.  The gallbladder was taken from the gallbladder fossa in a retrograde fashion with the electrocautery. The gallbladder was removed and placed in an Endocatch bag. The liver bed was irrigated and inspected. Hemostasis was achieved with the electrocautery. Copious irrigation was utilized and was repeatedly aspirated until clear.  The gallbladder and Endocatch sac were then removed through the epigastric port site.   The right upper quadrant identified some bleeding which was controlled with electrocautery and placement of 2 pieces of Surgicel. A 10 L JP drain was placed through a lateral port site into the foramen of Winslow and held in with 3-0 nylon.  Inspection of the right upper quadrant was performed. No bleeding, bile duct injury or leak, or bowel injury was noted. Pneumoperitoneum was released.  The epigastric port site  was closed with figure-of-eight 0  Vicryl sutures. 4-0 subcuticular Monocryl was used to close the skin. Steristrips and Mastisol and sterile dressings were  applied.  The patient was then extubated and brought to the recovery room in stable condition. Sponge, lap, and needle counts were correct at closure and at the conclusion of the case.   Findings: Acute Cholecystitis with hydrops  Estimated Blood Loss: 75 cc         Drains: JP 1         Specimens: Gallbladder           Complications: none               Austin Montee E. Excell Seltzerooper, MD, FACS

## 2015-09-25 NOTE — Anesthesia Procedure Notes (Signed)
Procedure Name: Intubation Date/Time: 09/25/2015 1:52 PM Performed by: Stormy FabianURTIS, Shraga Custard Pre-anesthesia Checklist: Patient identified, Patient being monitored, Timeout performed, Emergency Drugs available and Suction available Patient Re-evaluated:Patient Re-evaluated prior to inductionOxygen Delivery Method: Circle system utilized Preoxygenation: Pre-oxygenation with 100% oxygen Intubation Type: IV induction Ventilation: Mask ventilation without difficulty Laryngoscope Size: Mac and 3 Grade View: Grade I Tube type: Oral Tube size: 7.5 mm Number of attempts: 1 Airway Equipment and Method: Stylet Placement Confirmation: ETT inserted through vocal cords under direct vision,  positive ETCO2 and breath sounds checked- equal and bilateral Secured at: 23 cm Tube secured with: Tape Dental Injury: Teeth and Oropharynx as per pre-operative assessment

## 2015-09-25 NOTE — Transfer of Care (Signed)
Immediate Anesthesia Transfer of Care Note  Patient: Austin Nguyen  Procedure(s) Performed: Procedure(s): LAPAROSCOPIC CHOLECYSTECTOMY (N/A)  Patient Location: PACU  Anesthesia Type:General  Level of Consciousness: sedated  Airway & Oxygen Therapy: Patient Spontanous Breathing and Patient connected to face mask oxygen  Post-op Assessment: Report given to RN and Post -op Vital signs reviewed and stable  Post vital signs: Reviewed and stable  Last Vitals:  Filed Vitals:   09/25/15 1142 09/25/15 1453  BP: 117/79 111/67  Pulse: 90 93  Temp: 38.1 C 37.1 C  Resp: 16 20    Complications: No apparent anesthesia complications

## 2015-09-25 NOTE — Anesthesia Preprocedure Evaluation (Signed)
Anesthesia Evaluation  Patient identified by MRN, date of birth, ID band Patient awake    Reviewed: Allergy & Precautions, H&P , NPO status , Patient's Chart, lab work & pertinent test results, reviewed documented beta blocker date and time   Airway Mallampati: I  TM Distance: >3 FB Neck ROM: full    Dental no notable dental hx. (+) Teeth Intact   Pulmonary neg pulmonary ROS,    Pulmonary exam normal breath sounds clear to auscultation       Cardiovascular Exercise Tolerance: Good negative cardio ROS Normal cardiovascular exam Rhythm:regular Rate:Normal     Neuro/Psych negative neurological ROS  negative psych ROS   GI/Hepatic negative GI ROS, Neg liver ROS,   Endo/Other  negative endocrine ROS  Renal/GU negative Renal ROS  negative genitourinary   Musculoskeletal   Abdominal   Peds  Hematology negative hematology ROS (+)   Anesthesia Other Findings History reviewed. No pertinent past medical history.   Reproductive/Obstetrics negative OB ROS                             Anesthesia Physical Anesthesia Plan  ASA: I  Anesthesia Plan: General   Post-op Pain Management:    Induction:   Airway Management Planned:   Additional Equipment:   Intra-op Plan:   Post-operative Plan:   Informed Consent: I have reviewed the patients History and Physical, chart, labs and discussed the procedure including the risks, benefits and alternatives for the proposed anesthesia with the patient or authorized representative who has indicated his/her understanding and acceptance.   Dental Advisory Given  Plan Discussed with: Anesthesiologist, CRNA and Surgeon  Anesthesia Plan Comments:         Anesthesia Quick Evaluation  

## 2015-09-25 NOTE — Progress Notes (Signed)
Patient's history reviewed both with the admitting physician, the chart, and with the patient..  I agree with the diagnosis of acute cholecystitis and I agree with the plan for laparoscopic cholecystectomy. The options were discussed with the patient I also discussed the risks of bleeding infection recurrent symptoms failure to resolve all symptoms conversion to an open procedure bile duct damage bile duct leak retained common bile duct stone or bowel injury any which could require further surgery this is all reviewed for him he understood and agreed to proceed

## 2015-09-25 NOTE — Progress Notes (Signed)
Preoperative Review   Patient is met in the preoperative holding area. The history is reviewed in the chart and with the patient. I personally reviewed the options and rationale as well as the risks of this procedure that have been previously discussed with the patient. All questions asked by the patient and/or family were answered to their satisfaction.  Patient agrees to proceed with this procedure at this time.  Corrado Hymon E Joachim Carton M.D. FACS  

## 2015-09-26 LAB — CBC WITH DIFFERENTIAL/PLATELET
BASOS ABS: 0 10*3/uL (ref 0–0.1)
BASOS PCT: 0 %
EOS ABS: 0 10*3/uL (ref 0–0.7)
Eosinophils Relative: 0 %
HEMATOCRIT: 34.7 % — AB (ref 40.0–52.0)
Hemoglobin: 11.9 g/dL — ABNORMAL LOW (ref 13.0–18.0)
Lymphocytes Relative: 4 %
Lymphs Abs: 0.7 10*3/uL — ABNORMAL LOW (ref 1.0–3.6)
MCH: 30.6 pg (ref 26.0–34.0)
MCHC: 34.2 g/dL (ref 32.0–36.0)
MCV: 89.6 fL (ref 80.0–100.0)
MONO ABS: 1.1 10*3/uL — AB (ref 0.2–1.0)
Monocytes Relative: 7 %
NEUTROS ABS: 13.9 10*3/uL — AB (ref 1.4–6.5)
NEUTROS PCT: 89 %
PLATELETS: 206 10*3/uL (ref 150–440)
RBC: 3.88 MIL/uL — ABNORMAL LOW (ref 4.40–5.90)
RDW: 12.5 % (ref 11.5–14.5)
WBC: 15.7 10*3/uL — ABNORMAL HIGH (ref 3.8–10.6)

## 2015-09-26 LAB — COMPREHENSIVE METABOLIC PANEL
ALBUMIN: 2.8 g/dL — AB (ref 3.5–5.0)
ALT: 25 U/L (ref 17–63)
ANION GAP: 7 (ref 5–15)
AST: 28 U/L (ref 15–41)
Alkaline Phosphatase: 53 U/L (ref 38–126)
BILIRUBIN TOTAL: 0.9 mg/dL (ref 0.3–1.2)
BUN: 14 mg/dL (ref 6–20)
CHLORIDE: 103 mmol/L (ref 101–111)
CO2: 26 mmol/L (ref 22–32)
Calcium: 8.4 mg/dL — ABNORMAL LOW (ref 8.9–10.3)
Creatinine, Ser: 0.78 mg/dL (ref 0.61–1.24)
GFR calc Af Amer: >60 mL/min (ref >60)
GFR calc non Af Amer: >60 mL/min (ref >60)
GLUCOSE: 127 mg/dL — AB (ref 65–99)
POTASSIUM: 4.3 mmol/L (ref 3.5–5.1)
SODIUM: 136 mmol/L (ref 135–145)
Total Protein: 6.7 g/dL (ref 6.5–8.1)

## 2015-09-26 NOTE — Progress Notes (Signed)
Per Dr. Excell Seltzerooper okay to place pt on a Regular diet and saline lock IV.

## 2015-09-26 NOTE — Progress Notes (Signed)
Pt rested overnight, no complaints. VSS.

## 2015-09-26 NOTE — Progress Notes (Signed)
1 Day Post-Op  Subjective: Patient feels much better postop day 1. No nausea vomiting  Objective: Vital signs in last 24 hours: Temp:  [97.6 F (36.4 C)-100.6 F (38.1 C)] 98 F (36.7 C) (07/04 0453) Pulse Rate:  [58-93] 63 (07/04 0453) Resp:  [16-20] 18 (07/04 0453) BP: (99-117)/(57-79) 99/65 mmHg (07/04 0453) SpO2:  [92 %-99 %] 96 % (07/04 0453) Weight:  [210 lb (95.255 kg)] 210 lb (95.255 kg) (07/03 1142) Last BM Date: 09/22/15  Intake/Output from previous day: 07/03 0701 - 07/04 0700 In: 4986.8 [P.O.:1220; I.V.:3577.8; IV Piggyback:189] Out: 2915 [Urine:2725; Drains:90; Blood:100] Intake/Output this shift:    Physical exam:  Abdomen is soft nontender no icterus no jaundice no bile in drain  Lab Results: CBC   Recent Labs  09/25/15 0421 09/26/15 0458  WBC 20.4* 15.7*  HGB 13.5 11.9*  HCT 39.4* 34.7*  PLT 213 206   BMET  Recent Labs  09/25/15 0421 09/26/15 0458  NA 134* 136  K 4.1 4.3  CL 101 103  CO2 26 26  GLUCOSE 106* 127*  BUN 9 14  CREATININE 0.75 0.78  CALCIUM 8.6* 8.4*   PT/INR No results for input(s): LABPROT, INR in the last 72 hours. ABG No results for input(s): PHART, HCO3 in the last 72 hours.  Invalid input(s): PCO2, PO2  Studies/Results: No results found.  Anti-infectives: Anti-infectives    Start     Dose/Rate Route Frequency Ordered Stop   09/24/15 0730  piperacillin-tazobactam (ZOSYN) IVPB 3.375 g     3.375 g 12.5 mL/hr over 240 Minutes Intravenous Every 8 hours 09/24/15 0728        Assessment/Plan: s/p Procedure(s): LAPAROSCOPIC CHOLECYSTECTOMY   Patient doing very well will continue IV antibiotics advance diet and saline lock likely discharge tomorrow  Lattie Hawichard E Ayaka Andes, MD, FACS  09/26/2015

## 2015-09-27 LAB — CBC WITH DIFFERENTIAL/PLATELET
BASOS ABS: 0 10*3/uL (ref 0–0.1)
BASOS PCT: 0 %
EOS ABS: 0.1 10*3/uL (ref 0–0.7)
EOS PCT: 1 %
HCT: 33.5 % — ABNORMAL LOW (ref 40.0–52.0)
HEMOGLOBIN: 11.6 g/dL — AB (ref 13.0–18.0)
LYMPHS ABS: 1.8 10*3/uL (ref 1.0–3.6)
Lymphocytes Relative: 17 %
MCH: 30.9 pg (ref 26.0–34.0)
MCHC: 34.5 g/dL (ref 32.0–36.0)
MCV: 89.5 fL (ref 80.0–100.0)
Monocytes Absolute: 0.9 10*3/uL (ref 0.2–1.0)
Monocytes Relative: 9 %
NEUTROS PCT: 73 %
Neutro Abs: 7.6 10*3/uL — ABNORMAL HIGH (ref 1.4–6.5)
PLATELETS: 241 10*3/uL (ref 150–440)
RBC: 3.74 MIL/uL — AB (ref 4.40–5.90)
RDW: 12.8 % (ref 11.5–14.5)
WBC: 10.4 10*3/uL (ref 3.8–10.6)

## 2015-09-27 LAB — COMPREHENSIVE METABOLIC PANEL
ALBUMIN: 2.8 g/dL — AB (ref 3.5–5.0)
ALK PHOS: 56 U/L (ref 38–126)
ALT: 27 U/L (ref 17–63)
AST: 22 U/L (ref 15–41)
Anion gap: 6 (ref 5–15)
BUN: 17 mg/dL (ref 6–20)
CALCIUM: 8.2 mg/dL — AB (ref 8.9–10.3)
CHLORIDE: 108 mmol/L (ref 101–111)
CO2: 26 mmol/L (ref 22–32)
CREATININE: 0.89 mg/dL (ref 0.61–1.24)
GFR calc non Af Amer: >60 mL/min (ref >60)
GLUCOSE: 105 mg/dL — AB (ref 65–99)
Potassium: 3.9 mmol/L (ref 3.5–5.1)
SODIUM: 140 mmol/L (ref 135–145)
Total Bilirubin: 0.2 mg/dL — ABNORMAL LOW (ref 0.3–1.2)
Total Protein: 6.3 g/dL — ABNORMAL LOW (ref 6.5–8.1)

## 2015-09-27 MED ORDER — HYDROCODONE-ACETAMINOPHEN 5-325 MG PO TABS: 1.0000 | ORAL_TABLET | ORAL | Status: DC | PRN

## 2015-09-27 MED ORDER — CEPHALEXIN 500 MG PO CAPS: 500.0000 mg | ORAL_CAPSULE | Freq: Four times a day (QID) | ORAL | Status: DC

## 2015-09-27 NOTE — Discharge Instructions (Signed)
Remove dressing in 24 hours. °May shower in 24 hours. °Leave paper strips in place. °Resume all home medications. °Follow-up with Dr. Nicolaos Mitrano in 10 days. °

## 2015-09-27 NOTE — Addendum Note (Signed)
Addendum  created 09/27/15 0756 by Lenard SimmerAndrew Madeleine Fenn, MD   Modules edited: Clinical Notes   Clinical Notes:  File: [id number]

## 2015-09-27 NOTE — Progress Notes (Signed)
Outpatient postop visit  09/27/2015  Austin Nguyen is an 45 y.o. male.    Procedure: Lap or scopic cholecystectomy  CC: Minimal pain  HPI: Patient feels much better today is tolerating a diet smiling and not having any problems.  Medications reviewed.    Physical Exam:  BP 101/69 mmHg  Pulse 56  Temp(Src) 98 F (36.7 C) (Oral)  Resp 18  Ht 6\' 1"  (1.854 m)  Wt 210 lb (95.255 kg)  BMI 27.71 kg/m2  SpO2 97%    PE: No icterus no jaundice soft nontender abdomen no bile in drain.  Drain is removed dressing is placed.    Assessment/Plan:  Patient doing very well drain is removed today he will be discharged today on antibiotics and analgesics to follow up in 10 days.  Lattie Hawichard E Osie Amparo, MD, FACS

## 2015-09-27 NOTE — Anesthesia Postprocedure Evaluation (Addendum)
Anesthesia Post Note  Patient: Austin Nguyen  Procedure(s) Performed: Procedure(s) (LRB): LAPAROSCOPIC CHOLECYSTECTOMY (N/A)  Patient location during evaluation: PACU Anesthesia Type: General Level of consciousness: awake and alert Pain management: pain level controlled Vital Signs Assessment: post-procedure vital signs reviewed and stable Respiratory status: spontaneous breathing, nonlabored ventilation, respiratory function stable and patient connected to nasal cannula oxygen Cardiovascular status: blood pressure returned to baseline and stable Postop Assessment: no signs of nausea or vomiting Anesthetic complications: no    Last Vitals:  Filed Vitals:   09/27/15 0451 09/27/15 0544  BP: 101/69   Pulse: 48 56  Temp: 36.7 C   Resp: 18     Last Pain:  Filed Vitals:   09/27/15 0544  PainSc: 0-No pain                 Lenard SimmerAndrew Jonie Burdell

## 2015-09-27 NOTE — Progress Notes (Signed)
09/27/2015 13:00  Austin CastleBrian C Nguyen to be D/C'd Home per MD order.  Discussed prescriptions and follow up appointments with the patient. Prescriptions given to patient, medication list explained in detail. Pt verbalized understanding.    Medication List    STOP taking these medications        levofloxacin 500 MG tablet  Commonly known as:  LEVAQUIN     ondansetron 4 MG disintegrating tablet  Commonly known as:  ZOFRAN ODT     oxyCODONE-acetaminophen 5-325 MG tablet  Commonly known as:  ROXICET      TAKE these medications        cephALEXin 500 MG capsule  Commonly known as:  KEFLEX  Take 1 capsule (500 mg total) by mouth 4 (four) times daily.     docusate sodium 100 MG capsule  Commonly known as:  COLACE  Take 1 tablet once or twice daily as needed for constipation while taking narcotic pain medicine     HYDROcodone-acetaminophen 5-325 MG tablet  Commonly known as:  NORCO/VICODIN  Take 1-2 tablets by mouth every 4 (four) hours as needed for moderate pain.        Filed Vitals:   09/27/15 0451 09/27/15 0544  BP: 101/69   Pulse: 48 56  Temp: 98 F (36.7 C)   Resp: 18     Skin clean, dry and intact without evidence of skin break down, no evidence of skin tears noted. IV catheter discontinued intact. Site without signs and symptoms of complications. Dressing and pressure applied. Pt denies pain at this time. No complaints noted.  An After Visit Summary was printed and given to the patient. Patient escorted and D/C home via private auto.  Austin Nguyen, Austin Nguyen

## 2015-09-27 NOTE — Discharge Summary (Signed)
Physician Discharge Summary  Patient ID: Austin Nguyen MRN: [id number] DOB/AGE: 43-Jul-1972 43 y.o.  Admit date: 09/24/2015 Discharge date: 09/27/2015   Discharge Diagnoses:  Active Problems:   Cholecystitis, acute   Abdominal pain   Procedures:Laparoscopic cholecystectomy  Hospital Course: This patient status post laparoscopic cholecystectomy for cholecystitis. He is doing very well at this point is tolerating a diet and his drain is been removed he will be discharged today to follow-up in our office in 10 days he is given antibiotics as well as oral analgesics. Resting is been removed and he will shower today or tomorrow  Consults: None  Disposition: 01-Home or Self Care     Medication List    STOP taking these medications        levofloxacin 500 MG tablet  Commonly known as:  LEVAQUIN     ondansetron 4 MG disintegrating tablet  Commonly known as:  ZOFRAN ODT     oxyCODONE-acetaminophen 5-325 MG tablet  Commonly known as:  ROXICET      TAKE these medications        cephALEXin 500 MG capsule  Commonly known as:  KEFLEX  Take 1 capsule (500 mg total) by mouth 4 (four) times daily.     docusate sodium 100 MG capsule  Commonly known as:  COLACE  Take 1 tablet once or twice daily as needed for constipation while taking narcotic pain medicine     HYDROcodone-acetaminophen 5-325 MG tablet  Commonly known as:  NORCO/VICODIN  Take 1-2 tablets by mouth every 4 (four) hours as needed for moderate pain.           Follow-up Information    Follow up with Dionne Miloichard Cooper, MD In 10 days.   Specialty:  Surgery   Why:  For wound re-check   Contact information:   [address] [place] [phone]       Lattie Hawichard E Cooper, MD, FACS

## 2015-09-28 LAB — SURGICAL PATHOLOGY

## 2015-10-03 ENCOUNTER — Other Ambulatory Visit: Payer: Self-pay

## 2015-10-04 ENCOUNTER — Encounter: Payer: Self-pay | Admitting: Surgery

## 2015-10-04 ENCOUNTER — Ambulatory Visit (INDEPENDENT_AMBULATORY_CARE_PROVIDER_SITE_OTHER): Payer: BLUE CROSS/BLUE SHIELD | Admitting: Surgery

## 2015-10-04 VITALS — BP 127/79 | HR 99 | Temp 98.4°F | Ht 73.0 in | Wt 210.4 lb

## 2015-10-04 DIAGNOSIS — K810 Acute cholecystitis: Secondary | ICD-10-CM

## 2015-10-04 NOTE — Patient Instructions (Signed)
Please call our office with any questions or concerns.  Please do not submerge in a tub, hot tub, or pool until incisions are completely sealed.  Use sun block to incision area over the next year if this area will be exposed to sun. This helps decrease scarring.  You may now resume your normal activities. Listen to your body when lifting, if you have pain when lifting, stop and then try again in a few days.  If you develop redness, drainage, or pain at incision sites- call our office immediately and speak with a nurse.  Please see your work note provided.

## 2015-10-04 NOTE — Progress Notes (Signed)
45 yr old male s/p laparoscopic cholecystectomy for acute cholecystitis.  Patient is doing well saying that he is having no pain. Patient states his eating and drinking well and having bowel movements without any difficulty. Patient states that the incision sites to have some soreness but otherwise no drainage and no irritation.  Filed Vitals:   10/04/15 0951  BP: 127/79  Pulse: 99  Temp: 98.4 F (36.9 C)   PE:  Gen: NAD Abd: soft, appropriately tender, incisions c/d/i, no erythema or drainage  A/P:  Patient healing well after lap scopic cholecystectomy with Dr. Excell Seltzerooper on July 3. I discussed his pathology is just acute cholecystitis. Patient is healing well and was instructed not to lift anything over 20 pounds for the next 3 weeks. He can call the office with any questions or concerns.

## 2015-10-10 ENCOUNTER — Telehealth: Payer: Self-pay

## 2015-10-10 NOTE — Telephone Encounter (Signed)
Patient's Disability form was filled out and faxed to [employer].

## 2016-12-30 DIAGNOSIS — Z96662 Presence of left artificial ankle joint: Secondary | ICD-10-CM | POA: Diagnosis not present

## 2016-12-30 DIAGNOSIS — M79672 Pain in left foot: Secondary | ICD-10-CM | POA: Diagnosis not present

## 2016-12-30 DIAGNOSIS — Z471 Aftercare following joint replacement surgery: Secondary | ICD-10-CM | POA: Diagnosis not present

## 2016-12-30 DIAGNOSIS — M19072 Primary osteoarthritis, left ankle and foot: Secondary | ICD-10-CM | POA: Diagnosis not present

## 2016-12-30 DIAGNOSIS — M19172 Post-traumatic osteoarthritis, left ankle and foot: Secondary | ICD-10-CM | POA: Diagnosis not present

## 2017-10-25 DIAGNOSIS — L03116 Cellulitis of left lower limb: Secondary | ICD-10-CM | POA: Diagnosis not present

## 2017-12-10 DIAGNOSIS — M25372 Other instability, left ankle: Secondary | ICD-10-CM | POA: Diagnosis not present

## 2017-12-10 DIAGNOSIS — M19172 Post-traumatic osteoarthritis, left ankle and foot: Secondary | ICD-10-CM | POA: Diagnosis not present

## 2017-12-10 DIAGNOSIS — M216X2 Other acquired deformities of left foot: Secondary | ICD-10-CM | POA: Diagnosis not present

## 2017-12-10 DIAGNOSIS — F17200 Nicotine dependence, unspecified, uncomplicated: Secondary | ICD-10-CM | POA: Diagnosis not present

## 2017-12-10 DIAGNOSIS — M19072 Primary osteoarthritis, left ankle and foot: Secondary | ICD-10-CM | POA: Diagnosis not present

## 2017-12-10 DIAGNOSIS — Z96662 Presence of left artificial ankle joint: Secondary | ICD-10-CM | POA: Diagnosis not present

## 2018-04-18 DIAGNOSIS — J069 Acute upper respiratory infection, unspecified: Secondary | ICD-10-CM | POA: Diagnosis not present

## 2018-08-04 ENCOUNTER — Encounter (INDEPENDENT_AMBULATORY_CARE_PROVIDER_SITE_OTHER): Payer: Self-pay

## 2018-08-04 ENCOUNTER — Encounter: Payer: Self-pay | Admitting: Internal Medicine

## 2018-08-04 ENCOUNTER — Ambulatory Visit (INDEPENDENT_AMBULATORY_CARE_PROVIDER_SITE_OTHER): Payer: BLUE CROSS/BLUE SHIELD | Admitting: Internal Medicine

## 2018-08-04 ENCOUNTER — Other Ambulatory Visit: Payer: Self-pay

## 2018-08-04 VITALS — BP 124/76 | HR 88 | Temp 98.4°F | Wt 218.0 lb

## 2018-08-04 DIAGNOSIS — M25562 Pain in left knee: Secondary | ICD-10-CM

## 2018-08-04 DIAGNOSIS — M19072 Primary osteoarthritis, left ankle and foot: Secondary | ICD-10-CM

## 2018-08-04 MED ORDER — NAPROXEN 500 MG PO TABS: 500.0000 mg | ORAL_TABLET | Freq: Two times a day (BID) | ORAL | 0 refills | Status: DC

## 2018-08-04 NOTE — Assessment & Plan Note (Signed)
Continue Tumeric Encouraged regular physical activity Will monitor.

## 2018-08-04 NOTE — Patient Instructions (Signed)
RICE Therapy for Routine Care of Injuries  Many injuries can be cared for with rest, ice, compression, and elevation (RICE therapy). This includes:   Resting the injured part.   Putting ice on the injury.   Putting pressure (compression) on the injury.   Raising the injured part (elevation).  Using RICE therapy can help to lessen pain and swelling.  Supplies needed:   Ice.   Plastic bag.   Towel.   Elastic bandage.   Pillow or pillows to raise (elevate) your injured body part.  How to care for your injury with RICE therapy  Rest  Limit your normal activities, and try not to use the injured part of your body. You can go back to your normal activities when your doctor says it is okay to do them and you feel okay. Ask your doctor if you should do exercises to help your injury get better.  Ice  Put ice on the injured area. Do not put ice on your bare skin.   Put ice in a plastic bag.   Place a towel between your skin and the bag.   Leave the ice on for 20 minutes, 2-3 times a day. Use ice on as many days as told by your doctor.    Compression  Compression means putting pressure on the injured area. This can be done with an elastic bandage. If an elastic bandage has been put on your injury:   Do not wrap the bandage too tight. Wrap the bandage more loosely if part of your body away from the bandage is blue, swollen, cold, painful, or loses feeling (gets numb).   Take off the bandage and put it on again. Do this every 3-4 hours or as told by your doctor.   See your doctor if the bandage seems to make your problems worse.    Elevation  Elevation means keeping the injured area raised. If you can, raise the injured area above your heart or the center of your chest.  Contact a doctor if:   You keep having pain and swelling.   Your symptoms get worse.  Get help right away if:   You have sudden bad pain at your injury or lower than your injury.   You have redness or more swelling around your injury.   You  have tingling or numbness at your injury or lower than your injury, and it does not go away when you take off the bandage.  Summary   Many injuries can be cared for using rest, ice, compression, and elevation (RICE therapy).   You can go back to your normal activities when you feel okay and your doctor says it is okay.   Put ice on the injured area as told by your doctor.   Get help if your symptoms get worse or if you keep having pain and swelling.  This information is not intended to replace advice given to you by your health care provider. Make sure you discuss any questions you have with your health care provider.  Document Released: 08/28/2007 Document Revised: 11/29/2016 Document Reviewed: 11/29/2016  Elsevier Interactive Patient Education  2019 [employer]

## 2018-08-04 NOTE — Progress Notes (Signed)
HPI  Pt presents to the clinic today to establish care and for management of the conditions listed below.  OA: Left ankle. He takes Tumeric daily with good relief.  Panic Disorder: Remote history due to death of his mother. Currently not an issue. He no longer takes medication for this.  GERD: Improved after cholecystectomy. He denies any reflux at this time. He does not take any antacids OTC.  He also c/o left knee pain. This started 2-3 days ago. He reports he banged it on something, but he can't remember what. He reports some swelling and bruising. He is having some difficulty with weight bearing. He reports it has improved significantly. He has tried Tylenol OTC.   Flu: never Tetanus: 03/2015 Vision Screening: as needed Dentist: biannually  Past Medical History:  Diagnosis Date  . Cellulitis 10/17/2014   Right foot     . Cholecystitis, acute 09/24/2015  . EUSTACHIAN TUBE DYSFUNCTION, LEFT 08/24/2008   Qualifier: Diagnosis of  By: Jillyn HiddenBean FNP, Mcarthur RossettiBillie-Lynn Daniels   . Gastro-esophageal reflux disease without esophagitis 11/23/2014  . Panic disorder 09/21/2012  . Primary osteoarthritis, left ankle and foot 10/26/2014    Current Outpatient Medications  Medication Sig Dispense Refill  . OVER THE COUNTER MEDICATION Tumeric     No current facility-administered medications for this visit.     No Known Allergies  Family History  Problem Relation Age of Onset  . Lung cancer Mother   . Colon cancer Father   . Diabetes Paternal Grandfather     Social History   Socioeconomic History  . Marital status: Married    Spouse name: Not on file  . Number of children: Not on file  . Years of education: Not on file  . Highest education level: Not on file  Occupational History  . Not on file  Social Needs  . Financial resource strain: Not on file  . Food insecurity:    Worry: Not on file    Inability: Not on file  . Transportation needs:    Medical: Not on file    Non-medical: Not on  file  Tobacco Use  . Smoking status: Never Smoker  . Smokeless tobacco: Current User  Substance and Sexual Activity  . Alcohol use: Yes    Alcohol/week: 0.0 standard drinks    Comment: 6 Beers Weekly  . Drug use: No  . Sexual activity: Yes  Lifestyle  . Physical activity:    Days per week: Not on file    Minutes per session: Not on file  . Stress: Not on file  Relationships  . Social connections:    Talks on phone: Not on file    Gets together: Not on file    Attends religious service: Not on file    Active member of club or organization: Not on file    Attends meetings of clubs or organizations: Not on file    Relationship status: Not on file  . Intimate partner violence:    Fear of current or ex partner: Not on file    Emotionally abused: Not on file    Physically abused: Not on file    Forced sexual activity: Not on file  Other Topics Concern  . Not on file  Social History Narrative  . Not on file    ROS:  Constitutional: Denies fever, malaise, fatigue, headache or abrupt weight changes.  HEENT: Denies eye pain, eye redness, ear pain, ringing in the ears, wax buildup, runny nose, nasal congestion, bloody nose,  or sore throat. Respiratory: Denies difficulty breathing, shortness of breath, cough or sputum production.   Cardiovascular: Denies chest pain, chest tightness, palpitations or swelling in the hands or feet.  Gastrointestinal: Denies abdominal pain, bloating, constipation, diarrhea or blood in the stool.  GU: Denies frequency, urgency, pain with urination, blood in urine, odor or discharge. Musculoskeletal: Pt reports left knee pain and swelling. Denies decrease in range of motion, difficulty with gait, muscle paing.  Skin: Denies redness, rashes, lesions or ulcercations.  Neurological: Denies dizziness, difficulty with memory, difficulty with speech or problems with balance and coordination.  Psych: Denies anxiety, depression, SI/HI.  No other specific  complaints in a complete review of systems (except as listed in HPI above).  PE:  BP 124/76   Pulse 88   Temp 98.4 F (36.9 C) (Oral)   Wt 218 lb (98.9 kg)   SpO2 98%   BMI 28.76 kg/m   Wt Readings from Last 3 Encounters:  08/04/18 218 lb (98.9 kg)  10/04/15 210 lb 6.4 oz (95.4 kg)  09/25/15 210 lb (95.3 kg)    General: Appears his stated age, well developed, well nourished in NAD. Skin: No bruising or abrasion noted. Cardiovascular: Normal rate and rhythm. S1,S2 noted.  No murmur, rubs or gallops noted.  Pulmonary/Chest: Normal effort and positive vesicular breath sounds. No respiratory distress. No wheezes, rales or ronchi noted.  Musculoskeletal: Normal flexion and extension of the left knee. Lateral joint swelling noted. Pain with palpation of the medial joint line. Strength 5/5 BLE. No difficulty with gait.  Neurological: Alert and oriented.   BMET    Component Value Date/Time   NA 140 09/27/2015 0441   K 3.9 09/27/2015 0441   CL 108 09/27/2015 0441   CO2 26 09/27/2015 0441   GLUCOSE 105 (H) 09/27/2015 0441   BUN 17 09/27/2015 0441   CREATININE 0.89 09/27/2015 0441   CALCIUM 8.2 (L) 09/27/2015 0441   GFRNONAA >60 09/27/2015 0441   GFRAA >60 09/27/2015 0441    Lipid Panel     Component Value Date/Time   CHOL 145 09/02/2008 0906   TRIG 114.0 09/02/2008 0906   HDL 46.70 09/02/2008 0906   CHOLHDL 3 09/02/2008 0906   VLDL 22.8 09/02/2008 0906   LDLCALC 76 09/02/2008 0906    CBC    Component Value Date/Time   WBC 10.4 09/27/2015 0441   RBC 3.74 (L) 09/27/2015 0441   HGB 11.6 (L) 09/27/2015 0441   HCT 33.5 (L) 09/27/2015 0441   PLT 241 09/27/2015 0441   MCV 89.5 09/27/2015 0441   MCH 30.9 09/27/2015 0441   MCHC 34.5 09/27/2015 0441   RDW 12.8 09/27/2015 0441   LYMPHSABS 1.8 09/27/2015 0441   MONOABS 0.9 09/27/2015 0441   EOSABS 0.1 09/27/2015 0441   BASOSABS 0.0 09/27/2015 0441    Hgb A1C No results found for: HGBA1C   Assessment and  Plan:  Acute Left Knee Pain:  No indication for xray at this time Wear a brace while ambulating/working Ice may be helpful RX for Naproxen 500 mg BID x 7 days- avoid other OTC NSAID's  RTC if symptoms persist or worsen Nicki Reaper, NP

## 2019-05-20 ENCOUNTER — Other Ambulatory Visit: Payer: Self-pay

## 2019-05-20 ENCOUNTER — Encounter: Payer: Self-pay | Admitting: Internal Medicine

## 2019-05-20 ENCOUNTER — Ambulatory Visit (INDEPENDENT_AMBULATORY_CARE_PROVIDER_SITE_OTHER): Payer: BC Managed Care – PPO | Admitting: Internal Medicine

## 2019-05-20 DIAGNOSIS — R05 Cough: Secondary | ICD-10-CM

## 2019-05-20 DIAGNOSIS — R0981 Nasal congestion: Secondary | ICD-10-CM

## 2019-05-20 DIAGNOSIS — R059 Cough, unspecified: Secondary | ICD-10-CM

## 2019-05-20 MED ORDER — BENZONATATE 200 MG PO CAPS: 200.0000 mg | ORAL_CAPSULE | Freq: Two times a day (BID) | ORAL | 0 refills | Status: DC | PRN

## 2019-05-20 NOTE — Progress Notes (Signed)
Virtual Visit via Video Note  I connected with Austin Nguyen on 05/20/19 at  9:15 AM EST by a video enabled telemedicine application and verified that I am speaking with the correct person using two identifiers.  Location: Patient: Home Provider: Office   I discussed the limitations of evaluation and management by telemedicine and the availability of in person appointments. The patient expressed understanding and agreed to proceed.  History of Present Illness:  Pt reports nasal congestion and cough. This started 3 weeks ago. He is not blowing anything out of his nose.  The cough is productive of yellow mucus at times.  He denies headache, runny nose, ear pain, sore throat, loss of taste or smell or shortness of breath.  He denies fever, chills or body aches.  He has tried Mucinex DM OTC with minimal relief.  He reports his wife has had a similar cough for 2 weeks.  He denies other sick contacts or exposure to Covid that he is aware of.  He has no history of seasonal allergies.   Past Medical History:  Diagnosis Date  . Cellulitis 10/17/2014   Right foot     . Cholecystitis, acute 09/24/2015  . EUSTACHIAN TUBE DYSFUNCTION, LEFT 08/24/2008   Qualifier: Diagnosis of  By: Maxie Better FNP, Rosalita Levan   . Gastro-esophageal reflux disease without esophagitis 11/23/2014  . Panic disorder 09/21/2012  . Primary osteoarthritis, left ankle and foot 10/26/2014    Current Outpatient Medications  Medication Sig Dispense Refill  . naproxen (NAPROSYN) 500 MG tablet Take 1 tablet (500 mg total) by mouth 2 (two) times daily with a meal. 30 tablet 0  . OVER THE COUNTER MEDICATION Tumeric     No current facility-administered medications for this visit.    No Known Allergies  Family History  Problem Relation Age of Onset  . Lung cancer Mother   . Colon cancer Father   . Diabetes Paternal Grandfather     Social History   Socioeconomic History  . Marital status: Married    Spouse name: Not on  file  . Number of children: Not on file  . Years of education: Not on file  . Highest education level: Not on file  Occupational History  . Not on file  Tobacco Use  . Smoking status: Never Smoker  . Smokeless tobacco: Former Network engineer and Sexual Activity  . Alcohol use: Yes    Alcohol/week: 0.0 standard drinks    Comment: occasional  . Drug use: No  . Sexual activity: Yes  Other Topics Concern  . Not on file  Social History Narrative  . Not on file   Social Determinants of Health   Financial Resource Strain:   . Difficulty of Paying Living Expenses: Not on file  Food Insecurity:   . Worried About Charity fundraiser in the Last Year: Not on file  . Ran Out of Food in the Last Year: Not on file  Transportation Needs:   . Lack of Transportation (Medical): Not on file  . Lack of Transportation (Non-Medical): Not on file  Physical Activity:   . Days of Exercise per Week: Not on file  . Minutes of Exercise per Session: Not on file  Stress:   . Feeling of Stress : Not on file  Social Connections:   . Frequency of Communication with Friends and Family: Not on file  . Frequency of Social Gatherings with Friends and Family: Not on file  . Attends Religious Services: Not on  file  . Active Member of Clubs or Organizations: Not on file  . Attends Banker Meetings: Not on file  . Marital Status: Not on file  Intimate Partner Violence:   . Fear of Current or Ex-Partner: Not on file  . Emotionally Abused: Not on file  . Physically Abused: Not on file  . Sexually Abused: Not on file     Constitutional: Denies fever, malaise, fatigue, headache or abrupt weight changes.  HEENT: Patient reports nasal congestion.  Denies eye pain, eye redness, ear pain, ringing in the ears, wax buildup, runny nose, bloody nose, or sore throat. Respiratory: Pt reports cough. Denies difficulty breathing, shortness of breath, or sputum production.   Cardiovascular: Denies chest pain,  chest tightness, palpitations or swelling in the hands or feet.   No other specific complaints in a complete review of systems (except as listed in HPI above).    Observations/Objective:  Wt Readings from Last 3 Encounters:  08/04/18 218 lb (98.9 kg)  10/04/15 210 lb 6.4 oz (95.4 kg)  09/25/15 210 lb (95.3 kg)    General: Appears his stated age, well developed, well nourished in NAD. HEENT: Head: normal shape and size; Nose: no congestion noted ; Throat/Mouth: no hoarseness noted Pulmonary/Chest: Normal effort. No respiratory distress.  No cough noted Neurological: Alert and oriented.   BMET    Component Value Date/Time   NA 140 09/27/2015 0441   K 3.9 09/27/2015 0441   CL 108 09/27/2015 0441   CO2 26 09/27/2015 0441   GLUCOSE 105 (H) 09/27/2015 0441   BUN 17 09/27/2015 0441   CREATININE 0.89 09/27/2015 0441   CALCIUM 8.2 (L) 09/27/2015 0441   GFRNONAA >60 09/27/2015 0441   GFRAA >60 09/27/2015 0441    Lipid Panel     Component Value Date/Time   CHOL 145 09/02/2008 0906   TRIG 114.0 09/02/2008 0906   HDL 46.70 09/02/2008 0906   CHOLHDL 3 09/02/2008 0906   VLDL 22.8 09/02/2008 0906   LDLCALC 76 09/02/2008 0906    CBC    Component Value Date/Time   WBC 10.4 09/27/2015 0441   RBC 3.74 (L) 09/27/2015 0441   HGB 11.6 (L) 09/27/2015 0441   HCT 33.5 (L) 09/27/2015 0441   PLT 241 09/27/2015 0441   MCV 89.5 09/27/2015 0441   MCH 30.9 09/27/2015 0441   MCHC 34.5 09/27/2015 0441   RDW 12.8 09/27/2015 0441   LYMPHSABS 1.8 09/27/2015 0441   MONOABS 0.9 09/27/2015 0441   EOSABS 0.1 09/27/2015 0441   BASOSABS 0.0 09/27/2015 0441    Hgb A1C No results found for: HGBA1C      Assessment and Plan:  Nasal Congestion, Cough:  Likely allergies vs mild covid symptoms Given that this started 3 weeks ago, no indication for testing as quarantine time is over No indication for antibiotics Stop Mucinex DM Start Zyrtec 10 mg QHS x 1 week Start Flonase 1 spray each  nostril BID x 3 days then daily in am x 1 week RX for Tessalon 200 mg TID prn for cough If no improvement by Monday, will consider antibiotics  Follow Up Instructions:    I discussed the assessment and treatment plan with the patient. The patient was provided an opportunity to ask questions and all were answered. The patient agreed with the plan and demonstrated an understanding of the instructions.   The patient was advised to call back or seek an in-person evaluation if the symptoms worsen or if the condition fails to  improve as anticipated.    Nicki Reaper, NP

## 2019-05-20 NOTE — Patient Instructions (Signed)

## 2019-09-22 DIAGNOSIS — Q666 Other congenital valgus deformities of feet: Secondary | ICD-10-CM | POA: Diagnosis not present

## 2019-09-22 DIAGNOSIS — M19072 Primary osteoarthritis, left ankle and foot: Secondary | ICD-10-CM | POA: Diagnosis not present

## 2019-09-22 DIAGNOSIS — M19172 Post-traumatic osteoarthritis, left ankle and foot: Secondary | ICD-10-CM | POA: Diagnosis not present

## 2019-09-22 DIAGNOSIS — S8252XD Displaced fracture of medial malleolus of left tibia, subsequent encounter for closed fracture with routine healing: Secondary | ICD-10-CM | POA: Diagnosis not present

## 2019-10-07 DIAGNOSIS — S92252A Displaced fracture of navicular [scaphoid] of left foot, initial encounter for closed fracture: Secondary | ICD-10-CM | POA: Diagnosis not present

## 2019-10-07 DIAGNOSIS — X58XXXA Exposure to other specified factors, initial encounter: Secondary | ICD-10-CM | POA: Diagnosis not present

## 2019-10-07 DIAGNOSIS — Q666 Other congenital valgus deformities of feet: Secondary | ICD-10-CM | POA: Diagnosis not present

## 2019-10-07 DIAGNOSIS — M19172 Post-traumatic osteoarthritis, left ankle and foot: Secondary | ICD-10-CM | POA: Diagnosis not present

## 2019-10-07 DIAGNOSIS — M19072 Primary osteoarthritis, left ankle and foot: Secondary | ICD-10-CM | POA: Diagnosis not present

## 2019-10-07 DIAGNOSIS — S8252XD Displaced fracture of medial malleolus of left tibia, subsequent encounter for closed fracture with routine healing: Secondary | ICD-10-CM | POA: Diagnosis not present

## 2019-10-19 DIAGNOSIS — Z96662 Presence of left artificial ankle joint: Secondary | ICD-10-CM | POA: Diagnosis not present

## 2019-10-19 DIAGNOSIS — M19072 Primary osteoarthritis, left ankle and foot: Secondary | ICD-10-CM | POA: Diagnosis not present

## 2019-10-19 DIAGNOSIS — S92255D Nondisplaced fracture of navicular [scaphoid] of left foot, subsequent encounter for fracture with routine healing: Secondary | ICD-10-CM | POA: Diagnosis not present

## 2019-10-19 DIAGNOSIS — M19172 Post-traumatic osteoarthritis, left ankle and foot: Secondary | ICD-10-CM | POA: Diagnosis not present

## 2019-10-25 DIAGNOSIS — M19172 Post-traumatic osteoarthritis, left ankle and foot: Secondary | ICD-10-CM | POA: Diagnosis not present

## 2019-10-25 DIAGNOSIS — S92255A Nondisplaced fracture of navicular [scaphoid] of left foot, initial encounter for closed fracture: Secondary | ICD-10-CM | POA: Diagnosis not present

## 2019-10-25 DIAGNOSIS — Z96662 Presence of left artificial ankle joint: Secondary | ICD-10-CM | POA: Diagnosis not present

## 2019-10-25 DIAGNOSIS — M19072 Primary osteoarthritis, left ankle and foot: Secondary | ICD-10-CM | POA: Diagnosis not present

## 2019-11-04 DIAGNOSIS — S92255A Nondisplaced fracture of navicular [scaphoid] of left foot, initial encounter for closed fracture: Secondary | ICD-10-CM | POA: Diagnosis not present

## 2019-11-04 DIAGNOSIS — S92255K Nondisplaced fracture of navicular [scaphoid] of left foot, subsequent encounter for fracture with nonunion: Secondary | ICD-10-CM | POA: Diagnosis not present

## 2019-11-04 DIAGNOSIS — Y939 Activity, unspecified: Secondary | ICD-10-CM | POA: Diagnosis not present

## 2019-11-17 DIAGNOSIS — M19172 Post-traumatic osteoarthritis, left ankle and foot: Secondary | ICD-10-CM | POA: Diagnosis not present

## 2019-11-17 DIAGNOSIS — Z96662 Presence of left artificial ankle joint: Secondary | ICD-10-CM | POA: Diagnosis not present

## 2019-11-17 DIAGNOSIS — S92255A Nondisplaced fracture of navicular [scaphoid] of left foot, initial encounter for closed fracture: Secondary | ICD-10-CM | POA: Diagnosis not present

## 2019-11-17 DIAGNOSIS — M19072 Primary osteoarthritis, left ankle and foot: Secondary | ICD-10-CM | POA: Diagnosis not present

## 2019-12-15 DIAGNOSIS — M19172 Post-traumatic osteoarthritis, left ankle and foot: Secondary | ICD-10-CM | POA: Diagnosis not present

## 2019-12-15 DIAGNOSIS — Z96662 Presence of left artificial ankle joint: Secondary | ICD-10-CM | POA: Diagnosis not present

## 2019-12-15 DIAGNOSIS — S92255D Nondisplaced fracture of navicular [scaphoid] of left foot, subsequent encounter for fracture with routine healing: Secondary | ICD-10-CM | POA: Diagnosis not present

## 2019-12-15 DIAGNOSIS — M19072 Primary osteoarthritis, left ankle and foot: Secondary | ICD-10-CM | POA: Diagnosis not present

## 2020-01-14 DIAGNOSIS — S92255D Nondisplaced fracture of navicular [scaphoid] of left foot, subsequent encounter for fracture with routine healing: Secondary | ICD-10-CM | POA: Diagnosis not present

## 2020-01-14 DIAGNOSIS — X58XXXD Exposure to other specified factors, subsequent encounter: Secondary | ICD-10-CM | POA: Diagnosis not present

## 2020-02-02 DIAGNOSIS — R262 Difficulty in walking, not elsewhere classified: Secondary | ICD-10-CM | POA: Diagnosis not present

## 2020-02-02 DIAGNOSIS — M25571 Pain in right ankle and joints of right foot: Secondary | ICD-10-CM | POA: Diagnosis not present

## 2020-02-07 DIAGNOSIS — R262 Difficulty in walking, not elsewhere classified: Secondary | ICD-10-CM | POA: Diagnosis not present

## 2020-02-07 DIAGNOSIS — M25571 Pain in right ankle and joints of right foot: Secondary | ICD-10-CM | POA: Diagnosis not present

## 2020-02-09 DIAGNOSIS — M25571 Pain in right ankle and joints of right foot: Secondary | ICD-10-CM | POA: Diagnosis not present

## 2020-02-09 DIAGNOSIS — R262 Difficulty in walking, not elsewhere classified: Secondary | ICD-10-CM | POA: Diagnosis not present

## 2020-02-14 DIAGNOSIS — M19172 Post-traumatic osteoarthritis, left ankle and foot: Secondary | ICD-10-CM | POA: Diagnosis not present

## 2020-02-14 DIAGNOSIS — S92255D Nondisplaced fracture of navicular [scaphoid] of left foot, subsequent encounter for fracture with routine healing: Secondary | ICD-10-CM | POA: Diagnosis not present

## 2020-02-14 DIAGNOSIS — M19072 Primary osteoarthritis, left ankle and foot: Secondary | ICD-10-CM | POA: Diagnosis not present

## 2020-02-14 DIAGNOSIS — Z96662 Presence of left artificial ankle joint: Secondary | ICD-10-CM | POA: Diagnosis not present

## 2020-02-15 DIAGNOSIS — M25571 Pain in right ankle and joints of right foot: Secondary | ICD-10-CM | POA: Diagnosis not present

## 2020-02-15 DIAGNOSIS — R262 Difficulty in walking, not elsewhere classified: Secondary | ICD-10-CM | POA: Diagnosis not present

## 2020-02-22 DIAGNOSIS — R262 Difficulty in walking, not elsewhere classified: Secondary | ICD-10-CM | POA: Diagnosis not present

## 2020-02-22 DIAGNOSIS — M25571 Pain in right ankle and joints of right foot: Secondary | ICD-10-CM | POA: Diagnosis not present

## 2020-02-25 DIAGNOSIS — M25571 Pain in right ankle and joints of right foot: Secondary | ICD-10-CM | POA: Diagnosis not present

## 2020-02-25 DIAGNOSIS — R262 Difficulty in walking, not elsewhere classified: Secondary | ICD-10-CM | POA: Diagnosis not present

## 2020-02-29 DIAGNOSIS — R262 Difficulty in walking, not elsewhere classified: Secondary | ICD-10-CM | POA: Diagnosis not present

## 2020-02-29 DIAGNOSIS — M25571 Pain in right ankle and joints of right foot: Secondary | ICD-10-CM | POA: Diagnosis not present

## 2020-03-02 DIAGNOSIS — R262 Difficulty in walking, not elsewhere classified: Secondary | ICD-10-CM | POA: Diagnosis not present

## 2020-03-02 DIAGNOSIS — M25571 Pain in right ankle and joints of right foot: Secondary | ICD-10-CM | POA: Diagnosis not present

## 2020-03-07 DIAGNOSIS — R262 Difficulty in walking, not elsewhere classified: Secondary | ICD-10-CM | POA: Diagnosis not present

## 2020-03-07 DIAGNOSIS — M25571 Pain in right ankle and joints of right foot: Secondary | ICD-10-CM | POA: Diagnosis not present

## 2020-03-09 DIAGNOSIS — M25571 Pain in right ankle and joints of right foot: Secondary | ICD-10-CM | POA: Diagnosis not present

## 2020-03-09 DIAGNOSIS — R262 Difficulty in walking, not elsewhere classified: Secondary | ICD-10-CM | POA: Diagnosis not present

## 2020-03-13 DIAGNOSIS — R262 Difficulty in walking, not elsewhere classified: Secondary | ICD-10-CM | POA: Diagnosis not present

## 2020-03-13 DIAGNOSIS — M25571 Pain in right ankle and joints of right foot: Secondary | ICD-10-CM | POA: Diagnosis not present

## 2020-03-16 DIAGNOSIS — M25571 Pain in right ankle and joints of right foot: Secondary | ICD-10-CM | POA: Diagnosis not present

## 2020-03-16 DIAGNOSIS — R262 Difficulty in walking, not elsewhere classified: Secondary | ICD-10-CM | POA: Diagnosis not present

## 2020-05-17 DIAGNOSIS — M19072 Primary osteoarthritis, left ankle and foot: Secondary | ICD-10-CM | POA: Diagnosis not present

## 2020-05-17 DIAGNOSIS — S92255D Nondisplaced fracture of navicular [scaphoid] of left foot, subsequent encounter for fracture with routine healing: Secondary | ICD-10-CM | POA: Diagnosis not present

## 2020-05-17 DIAGNOSIS — Z96662 Presence of left artificial ankle joint: Secondary | ICD-10-CM | POA: Diagnosis not present

## 2020-05-17 DIAGNOSIS — M19172 Post-traumatic osteoarthritis, left ankle and foot: Secondary | ICD-10-CM | POA: Diagnosis not present

## 2020-11-01 DIAGNOSIS — F17200 Nicotine dependence, unspecified, uncomplicated: Secondary | ICD-10-CM | POA: Diagnosis not present

## 2020-11-01 DIAGNOSIS — M19172 Post-traumatic osteoarthritis, left ankle and foot: Secondary | ICD-10-CM | POA: Diagnosis not present

## 2020-11-01 DIAGNOSIS — M19072 Primary osteoarthritis, left ankle and foot: Secondary | ICD-10-CM | POA: Diagnosis not present

## 2020-11-01 DIAGNOSIS — Z96662 Presence of left artificial ankle joint: Secondary | ICD-10-CM | POA: Diagnosis not present

## 2023-01-27 DIAGNOSIS — S92255A Nondisplaced fracture of navicular [scaphoid] of left foot, initial encounter for closed fracture: Secondary | ICD-10-CM | POA: Diagnosis not present

## 2023-01-27 DIAGNOSIS — M19172 Post-traumatic osteoarthritis, left ankle and foot: Secondary | ICD-10-CM | POA: Diagnosis not present

## 2023-01-27 DIAGNOSIS — S8252XD Displaced fracture of medial malleolus of left tibia, subsequent encounter for closed fracture with routine healing: Secondary | ICD-10-CM | POA: Diagnosis not present

## 2023-01-27 DIAGNOSIS — M19072 Primary osteoarthritis, left ankle and foot: Secondary | ICD-10-CM | POA: Diagnosis not present

## 2023-01-29 DIAGNOSIS — X58XXXA Exposure to other specified factors, initial encounter: Secondary | ICD-10-CM | POA: Diagnosis not present

## 2023-01-29 DIAGNOSIS — M19072 Primary osteoarthritis, left ankle and foot: Secondary | ICD-10-CM | POA: Diagnosis not present

## 2023-01-29 DIAGNOSIS — Z96662 Presence of left artificial ankle joint: Secondary | ICD-10-CM | POA: Diagnosis not present

## 2023-01-29 DIAGNOSIS — S92512A Displaced fracture of proximal phalanx of left lesser toe(s), initial encounter for closed fracture: Secondary | ICD-10-CM | POA: Diagnosis not present

## 2023-02-03 DIAGNOSIS — M19072 Primary osteoarthritis, left ankle and foot: Secondary | ICD-10-CM | POA: Diagnosis not present

## 2023-02-03 DIAGNOSIS — M19172 Post-traumatic osteoarthritis, left ankle and foot: Secondary | ICD-10-CM | POA: Diagnosis not present

## 2023-02-17 DIAGNOSIS — M19172 Post-traumatic osteoarthritis, left ankle and foot: Secondary | ICD-10-CM | POA: Diagnosis not present

## 2023-02-17 DIAGNOSIS — M19072 Primary osteoarthritis, left ankle and foot: Secondary | ICD-10-CM | POA: Diagnosis not present

## 2023-02-17 DIAGNOSIS — Q666 Other congenital valgus deformities of feet: Secondary | ICD-10-CM | POA: Diagnosis not present

## 2023-02-17 DIAGNOSIS — Z96662 Presence of left artificial ankle joint: Secondary | ICD-10-CM | POA: Diagnosis not present

## 2023-03-12 DIAGNOSIS — Z96662 Presence of left artificial ankle joint: Secondary | ICD-10-CM | POA: Diagnosis not present

## 2023-03-12 DIAGNOSIS — M19172 Post-traumatic osteoarthritis, left ankle and foot: Secondary | ICD-10-CM | POA: Diagnosis not present

## 2023-03-12 DIAGNOSIS — M19072 Primary osteoarthritis, left ankle and foot: Secondary | ICD-10-CM | POA: Diagnosis not present

## 2023-04-16 ENCOUNTER — Other Ambulatory Visit: Payer: Self-pay

## 2023-04-18 ENCOUNTER — Ambulatory Visit: Payer: BC Managed Care – PPO | Admitting: General Practice

## 2023-04-18 ENCOUNTER — Encounter: Payer: Self-pay | Admitting: General Practice

## 2023-04-18 VITALS — BP 106/58 | HR 76 | Temp 97.5°F | Ht 71.97 in | Wt 223.0 lb

## 2023-04-18 DIAGNOSIS — Z1211 Encounter for screening for malignant neoplasm of colon: Secondary | ICD-10-CM | POA: Diagnosis not present

## 2023-04-18 DIAGNOSIS — M19079 Primary osteoarthritis, unspecified ankle and foot: Secondary | ICD-10-CM

## 2023-04-18 DIAGNOSIS — Z0000 Encounter for general adult medical examination without abnormal findings: Secondary | ICD-10-CM | POA: Insufficient documentation

## 2023-04-18 NOTE — Patient Instructions (Signed)
Good luck with your surgery.   It was a pleasure meeting you!

## 2023-04-18 NOTE — Assessment & Plan Note (Signed)
Due to have surgery in February.

## 2023-04-18 NOTE — Assessment & Plan Note (Signed)
Family hx of colon cancer.   His dad was diagnosed in his 68s.   Referral placed.

## 2023-04-18 NOTE — Assessment & Plan Note (Signed)
Immunizations tetanus UTD, declines flu and shingles vaccine. Discussed shingles vaccine at length. Colonoscopy due   Discussed the importance of a healthy diet and regular exercise in order for weight loss, and to reduce the risk of further co-morbidity.  Exam stable. Labs completed at [facility] in November- reviewed.  Follow up in 1 year for repeat physical.

## 2023-04-18 NOTE — Progress Notes (Signed)
New Patient Office Visit  Subjective    Patient ID: Austin Nguyen, male    DOB: 01-14-1971  Age: 53 y.o. MRN: [id number]  CC:  Chief Complaint  Patient presents with   Annual Exam   Establish Care    No other concerns that he needs to discuss    HPI Austin Nguyen is a 53 y.o. male presents to establish care, for complete physical and follow up of chronic conditions.  Chronic ankle pain: following foot/ankle specialist at [facility]. He has multiple surgeries in the past however he started to have pain again. He was evaluated and was told he would need some fusions. Scheduled for surgery in February for left ankle.   Immunizations: -Tetanus: Completed in 2017. -Influenza: declines -Shingles: declines  Diet: Fair diet.  Exercise: No regular exercise. Very physical active at work.  Eye exam: Completes annually  Dental exam: Completes semi-annually    Colonoscopy: never completed. Due   Outpatient Encounter Medications as of 04/18/2023  Medication Sig   [DISCONTINUED] Acetaminophen 500 MG capsule Take by mouth.   [DISCONTINUED] benzonatate (TESSALON) 200 MG capsule Take 1 capsule (200 mg total) by mouth 2 (two) times daily as needed for cough.   [DISCONTINUED] Cholecalciferol 125 MCG (5000 UT) TABS Take by mouth.   [DISCONTINUED] oxyCODONE (OXY IR/ROXICODONE) 5 MG immediate release tablet Take 1 tablet every 6 hours prn pain, up to 6 tablets per day   No facility-administered encounter medications on file as of 04/18/2023.    Past Medical History:  Diagnosis Date   Cellulitis 10/17/2014   Right foot      Cholecystitis, acute 09/24/2015   EUSTACHIAN TUBE DYSFUNCTION, LEFT 08/24/2008   Qualifier: Diagnosis of  By: Jillyn Hidden FNP, Mcarthur Rossetti    Gastro-esophageal reflux disease without esophagitis 11/23/2014   Panic disorder 09/21/2012   Primary osteoarthritis, left ankle and foot 10/26/2014    Past Surgical History:  Procedure Laterality Date   CHOLECYSTECTOMY N/A 09/25/2015    Procedure: LAPAROSCOPIC CHOLECYSTECTOMY;  Surgeon: Lattie Haw, MD;  Location: [facility] ORS;  Service: General;  Laterality: N/A;   HERNIA REPAIR Left 1840- 77 years old   Inguinal   JOINT REPLACEMENT Left 11/2014   Ankle    Family History  Problem Relation Age of Onset   Lung cancer Mother    Colon cancer Father    Diabetes Paternal Grandfather     Social History   Socioeconomic History   Marital status: Married    Spouse name: Not on file   Number of children: Not on file   Years of education: Not on file   Highest education level: Not on file  Occupational History   Not on file  Tobacco Use   Smoking status: Never   Smokeless tobacco: Former  Substance and Sexual Activity   Alcohol use: Yes    Alcohol/week: 0.0 standard drinks of alcohol    Comment: occasional   Drug use: No   Sexual activity: Yes  Other Topics Concern   Not on file  Social History Narrative   Not on file   Social Drivers of Health   Financial Resource Strain: Low Risk  (01/27/2023)   Received from [facility]   Overall Financial Resource Strain (CARDIA)    Difficulty of Paying Living Expenses: Not very hard  Food Insecurity: No Food Insecurity (01/27/2023)   [facility]   Hunger Vital Sign    Worried About Running Out of Food in  the Last Year: Never true    Ran Out of Food in the Last Year: Never true  Transportation Needs: No Transportation Needs (01/27/2023)   Received from [facility]    In the past 12 months, has lack of transportation kept you from medical appointments or from getting medications?: No    Lack of Transportation (Non-Medical): No  Physical Activity: Not on file  Stress: Not on file  Social Connections: Not on file  Intimate Partner Violence: Not on file    Review of Systems  Constitutional:  Negative for chills, fever, malaise/fatigue and weight loss.  HENT:  Negative for  congestion, ear discharge, ear pain, hearing loss, nosebleeds, sinus pain, sore throat and tinnitus.   Eyes:  Negative for blurred vision, double vision, pain, discharge and redness.  Respiratory:  Negative for cough, shortness of breath, wheezing and stridor.   Cardiovascular:  Negative for chest pain, palpitations and leg swelling.  Gastrointestinal:  Negative for abdominal pain, constipation, diarrhea, heartburn, nausea and vomiting.  Genitourinary:  Negative for dysuria, frequency and urgency.  Musculoskeletal:  Negative for myalgias.  Skin:  Negative for rash.  Neurological:  Negative for dizziness, tingling, seizures, weakness and headaches.  Psychiatric/Behavioral:  Negative for depression, substance abuse and suicidal ideas. The patient is not nervous/anxious.         Objective    BP (!) 106/58   Pulse 76   Temp (!) 97.5 F (36.4 C) (Oral)   Ht 5' 11.97" (1.828 m)   Wt 223 lb (101.2 kg)   SpO2 95%   BMI 30.27 kg/m   Physical Exam Vitals and nursing note reviewed.  Constitutional:      Appearance: Normal appearance.  HENT:     Head: Normocephalic and atraumatic.     Right Ear: Tympanic membrane, ear canal and external ear normal.     Left Ear: Tympanic membrane, ear canal and external ear normal.     Nose: Nose normal.     Mouth/Throat:     Mouth: Mucous membranes are moist.     Pharynx: Oropharynx is clear.  Eyes:     Conjunctiva/sclera: Conjunctivae normal.     Pupils: Pupils are equal, round, and reactive to light.  Cardiovascular:     Rate and Rhythm: Normal rate and regular rhythm.     Pulses: Normal pulses.     Heart sounds: Normal heart sounds.  Pulmonary:     Effort: Pulmonary effort is normal.     Breath sounds: Normal breath sounds.  Abdominal:     General: Abdomen is flat. Bowel sounds are normal.     Palpations: Abdomen is soft.  Musculoskeletal:        General: Normal range of motion.     Cervical back: Normal range of motion.  Skin:     General: Skin is warm and dry.     Capillary Refill: Capillary refill takes less than 2 seconds.  Neurological:     General: No focal deficit present.     Mental Status: He is alert and oriented to person, place, and time. Mental status is at baseline.  Psychiatric:        Mood and Affect: Mood normal.        Behavior: Behavior normal.        Thought Content: Thought content normal.        Judgment: Judgment normal.         Assessment & Plan:  Screening for  colon cancer Assessment & Plan: Family hx of colon cancer.   His dad was diagnosed in his 52s.   Referral placed.  Orders: -     Ambulatory referral to Gastroenterology  Encounter for screening and preventative care Assessment & Plan: Immunizations tetanus UTD, declines flu and shingles vaccine. Discussed shingles vaccine at length. Colonoscopy due   Discussed the importance of a healthy diet and regular exercise in order for weight loss, and to reduce the risk of further co-morbidity.  Exam stable. Labs completed at [facility] in November- reviewed.  Follow up in 1 year for repeat physical.    Primary osteoarthritis of ankle, unspecified laterality Assessment & Plan: Due to have surgery in February.       Return in about 1 year (around 04/17/2024) for physical.   [place] Charon, NP

## 2023-04-23 ENCOUNTER — Telehealth: Payer: Self-pay

## 2023-04-23 DIAGNOSIS — Z800 Family history of malignant neoplasm of digestive organs: Secondary | ICD-10-CM

## 2023-04-23 DIAGNOSIS — Z1211 Encounter for screening for malignant neoplasm of colon: Secondary | ICD-10-CM

## 2023-04-23 NOTE — Telephone Encounter (Signed)
Gastroenterology Pre-Procedure Review  Request Date: TBD Requesting Physician: Dr. Jodelle Gross  PATIENT REVIEW QUESTIONS: The patient responded to the following health history questions as indicated:    1. Are you having any GI issues? no 2. Do you have a personal history of Polyps? no 3. Do you have a family history of Colon Cancer or Polyps? yes (father colon cancer) 4. Diabetes Mellitus? no 5. Joint replacements in the past 12 months?no 6. Major health problems in the past 3 months?no 7. Any artificial heart valves, MVP, or defibrillator?no    MEDICATIONS & ALLERGIES:    Patient reports the following regarding taking any anticoagulation/antiplatelet therapy:   Plavix, Coumadin, Eliquis, Xarelto, Lovenox, Pradaxa, Brilinta, or Effient? no Aspirin? no  Patient confirms/reports the following medications:  No current outpatient medications on file.   No current facility-administered medications for this visit.    Patient confirms/reports the following allergies:  No Known Allergies  No orders of the defined types were placed in this encounter.   AUTHORIZATION INFORMATION Primary Insurance: 1D#: Group #:  Secondary Insurance: 1D#: Group #:  SCHEDULE INFORMATION: Date: TBD Time: Location: TBD

## 2023-04-23 NOTE — Telephone Encounter (Signed)
The patient called in to schedule his colonoscopy.

## 2023-05-12 DIAGNOSIS — M19172 Post-traumatic osteoarthritis, left ankle and foot: Secondary | ICD-10-CM | POA: Diagnosis not present

## 2023-05-12 DIAGNOSIS — M216X2 Other acquired deformities of left foot: Secondary | ICD-10-CM | POA: Diagnosis not present

## 2023-05-12 DIAGNOSIS — M19072 Primary osteoarthritis, left ankle and foot: Secondary | ICD-10-CM | POA: Diagnosis not present

## 2023-05-12 DIAGNOSIS — Z96662 Presence of left artificial ankle joint: Secondary | ICD-10-CM | POA: Diagnosis not present

## 2023-05-22 DIAGNOSIS — Z96662 Presence of left artificial ankle joint: Secondary | ICD-10-CM | POA: Diagnosis not present

## 2023-05-22 DIAGNOSIS — M25772 Osteophyte, left ankle: Secondary | ICD-10-CM | POA: Diagnosis not present

## 2023-05-22 DIAGNOSIS — M85672 Other cyst of bone, left ankle and foot: Secondary | ICD-10-CM | POA: Diagnosis not present

## 2023-05-22 DIAGNOSIS — M7752 Other enthesopathy of left foot: Secondary | ICD-10-CM | POA: Diagnosis not present

## 2023-05-22 DIAGNOSIS — Z87891 Personal history of nicotine dependence: Secondary | ICD-10-CM | POA: Diagnosis not present

## 2023-05-22 DIAGNOSIS — M25372 Other instability, left ankle: Secondary | ICD-10-CM | POA: Diagnosis not present

## 2023-05-22 DIAGNOSIS — Q666 Other congenital valgus deformities of feet: Secondary | ICD-10-CM | POA: Diagnosis not present

## 2023-05-22 DIAGNOSIS — Y831 Surgical operation with implant of artificial internal device as the cause of abnormal reaction of the patient, or of later complication, without mention of misadventure at the time of the procedure: Secondary | ICD-10-CM | POA: Diagnosis not present

## 2023-05-22 DIAGNOSIS — M069 Rheumatoid arthritis, unspecified: Secondary | ICD-10-CM | POA: Diagnosis not present

## 2023-05-22 DIAGNOSIS — M19172 Post-traumatic osteoarthritis, left ankle and foot: Secondary | ICD-10-CM | POA: Diagnosis not present

## 2023-05-22 DIAGNOSIS — X58XXXA Exposure to other specified factors, initial encounter: Secondary | ICD-10-CM | POA: Diagnosis not present

## 2023-05-22 DIAGNOSIS — M25872 Other specified joint disorders, left ankle and foot: Secondary | ICD-10-CM | POA: Diagnosis not present

## 2023-05-22 DIAGNOSIS — S92255A Nondisplaced fracture of navicular [scaphoid] of left foot, initial encounter for closed fracture: Secondary | ICD-10-CM | POA: Diagnosis not present

## 2023-05-22 DIAGNOSIS — M25672 Stiffness of left ankle, not elsewhere classified: Secondary | ICD-10-CM | POA: Diagnosis not present

## 2023-05-22 DIAGNOSIS — T8484XA Pain due to internal orthopedic prosthetic devices, implants and grafts, initial encounter: Secondary | ICD-10-CM | POA: Diagnosis not present

## 2023-05-22 DIAGNOSIS — M19072 Primary osteoarthritis, left ankle and foot: Secondary | ICD-10-CM | POA: Diagnosis not present

## 2023-05-22 DIAGNOSIS — S8252XA Displaced fracture of medial malleolus of left tibia, initial encounter for closed fracture: Secondary | ICD-10-CM | POA: Diagnosis not present

## 2023-05-22 DIAGNOSIS — M778 Other enthesopathies, not elsewhere classified: Secondary | ICD-10-CM | POA: Diagnosis not present

## 2023-05-22 DIAGNOSIS — G8918 Other acute postprocedural pain: Secondary | ICD-10-CM | POA: Diagnosis not present

## 2023-05-23 DIAGNOSIS — Z87891 Personal history of nicotine dependence: Secondary | ICD-10-CM | POA: Diagnosis not present

## 2023-05-23 DIAGNOSIS — M25372 Other instability, left ankle: Secondary | ICD-10-CM | POA: Diagnosis not present

## 2023-05-23 DIAGNOSIS — M19072 Primary osteoarthritis, left ankle and foot: Secondary | ICD-10-CM | POA: Diagnosis not present

## 2023-05-23 DIAGNOSIS — S8252XA Displaced fracture of medial malleolus of left tibia, initial encounter for closed fracture: Secondary | ICD-10-CM | POA: Diagnosis not present

## 2023-05-23 DIAGNOSIS — M25872 Other specified joint disorders, left ankle and foot: Secondary | ICD-10-CM | POA: Diagnosis not present

## 2023-05-23 DIAGNOSIS — M25672 Stiffness of left ankle, not elsewhere classified: Secondary | ICD-10-CM | POA: Diagnosis not present

## 2023-05-23 DIAGNOSIS — M19172 Post-traumatic osteoarthritis, left ankle and foot: Secondary | ICD-10-CM | POA: Diagnosis not present

## 2023-05-23 DIAGNOSIS — M25772 Osteophyte, left ankle: Secondary | ICD-10-CM | POA: Diagnosis not present

## 2023-05-23 DIAGNOSIS — M069 Rheumatoid arthritis, unspecified: Secondary | ICD-10-CM | POA: Diagnosis not present

## 2023-05-23 DIAGNOSIS — T8484XA Pain due to internal orthopedic prosthetic devices, implants and grafts, initial encounter: Secondary | ICD-10-CM | POA: Diagnosis not present

## 2023-05-23 DIAGNOSIS — S92255A Nondisplaced fracture of navicular [scaphoid] of left foot, initial encounter for closed fracture: Secondary | ICD-10-CM | POA: Diagnosis not present

## 2023-05-23 DIAGNOSIS — X58XXXA Exposure to other specified factors, initial encounter: Secondary | ICD-10-CM | POA: Diagnosis not present

## 2023-05-23 DIAGNOSIS — Q666 Other congenital valgus deformities of feet: Secondary | ICD-10-CM | POA: Diagnosis not present

## 2023-05-23 DIAGNOSIS — M85672 Other cyst of bone, left ankle and foot: Secondary | ICD-10-CM | POA: Diagnosis not present

## 2023-05-23 DIAGNOSIS — Y831 Surgical operation with implant of artificial internal device as the cause of abnormal reaction of the patient, or of later complication, without mention of misadventure at the time of the procedure: Secondary | ICD-10-CM | POA: Diagnosis not present

## 2023-05-23 DIAGNOSIS — M7752 Other enthesopathy of left foot: Secondary | ICD-10-CM | POA: Diagnosis not present

## 2023-06-02 NOTE — Telephone Encounter (Signed)
 Patient just had surgery last week.  Informed him I will check back in April or May to see if he is ready to schedule his colonoscopy.  Thanks, [place], [place]

## 2023-06-16 DIAGNOSIS — M19172 Post-traumatic osteoarthritis, left ankle and foot: Secondary | ICD-10-CM | POA: Diagnosis not present

## 2023-06-16 DIAGNOSIS — M19072 Primary osteoarthritis, left ankle and foot: Secondary | ICD-10-CM | POA: Diagnosis not present

## 2023-06-16 DIAGNOSIS — M216X2 Other acquired deformities of left foot: Secondary | ICD-10-CM | POA: Diagnosis not present

## 2023-06-16 DIAGNOSIS — Z96662 Presence of left artificial ankle joint: Secondary | ICD-10-CM | POA: Diagnosis not present

## 2023-06-30 DIAGNOSIS — M19072 Primary osteoarthritis, left ankle and foot: Secondary | ICD-10-CM | POA: Diagnosis not present

## 2023-07-28 DIAGNOSIS — M19072 Primary osteoarthritis, left ankle and foot: Secondary | ICD-10-CM | POA: Diagnosis not present

## 2023-07-28 DIAGNOSIS — Z96662 Presence of left artificial ankle joint: Secondary | ICD-10-CM | POA: Diagnosis not present

## 2023-07-28 DIAGNOSIS — Z471 Aftercare following joint replacement surgery: Secondary | ICD-10-CM | POA: Diagnosis not present

## 2023-08-11 DIAGNOSIS — Z96662 Presence of left artificial ankle joint: Secondary | ICD-10-CM | POA: Diagnosis not present

## 2023-08-11 DIAGNOSIS — Z0189 Encounter for other specified special examinations: Secondary | ICD-10-CM | POA: Diagnosis not present

## 2023-08-11 DIAGNOSIS — M19172 Post-traumatic osteoarthritis, left ankle and foot: Secondary | ICD-10-CM | POA: Diagnosis not present

## 2023-08-11 DIAGNOSIS — M19072 Primary osteoarthritis, left ankle and foot: Secondary | ICD-10-CM | POA: Diagnosis not present

## 2023-08-11 DIAGNOSIS — M216X2 Other acquired deformities of left foot: Secondary | ICD-10-CM | POA: Diagnosis not present

## 2023-08-12 DIAGNOSIS — Z96662 Presence of left artificial ankle joint: Secondary | ICD-10-CM | POA: Diagnosis not present

## 2023-08-25 NOTE — Telephone Encounter (Signed)
 Patient stated that he was advised that he will need to wait a year before having his colonoscopy due to the ankle surgery.  He said he was advised of this last week.  Thanks,  [place], CMA

## 2023-08-28 DIAGNOSIS — M25572 Pain in left ankle and joints of left foot: Secondary | ICD-10-CM | POA: Diagnosis not present

## 2023-09-01 DIAGNOSIS — M25572 Pain in left ankle and joints of left foot: Secondary | ICD-10-CM | POA: Diagnosis not present

## 2023-09-03 DIAGNOSIS — M19172 Post-traumatic osteoarthritis, left ankle and foot: Secondary | ICD-10-CM | POA: Diagnosis not present

## 2023-09-03 DIAGNOSIS — Z981 Arthrodesis status: Secondary | ICD-10-CM | POA: Diagnosis not present

## 2023-09-03 DIAGNOSIS — Z96662 Presence of left artificial ankle joint: Secondary | ICD-10-CM | POA: Diagnosis not present

## 2023-09-03 DIAGNOSIS — Z471 Aftercare following joint replacement surgery: Secondary | ICD-10-CM | POA: Diagnosis not present

## 2023-09-03 DIAGNOSIS — M19072 Primary osteoarthritis, left ankle and foot: Secondary | ICD-10-CM | POA: Diagnosis not present

## 2023-09-04 DIAGNOSIS — M25572 Pain in left ankle and joints of left foot: Secondary | ICD-10-CM | POA: Diagnosis not present

## 2023-09-08 DIAGNOSIS — M25572 Pain in left ankle and joints of left foot: Secondary | ICD-10-CM | POA: Diagnosis not present

## 2023-09-12 DIAGNOSIS — M25572 Pain in left ankle and joints of left foot: Secondary | ICD-10-CM | POA: Diagnosis not present

## 2023-09-15 DIAGNOSIS — M25572 Pain in left ankle and joints of left foot: Secondary | ICD-10-CM | POA: Diagnosis not present

## 2023-09-18 DIAGNOSIS — M25572 Pain in left ankle and joints of left foot: Secondary | ICD-10-CM | POA: Diagnosis not present

## 2023-09-22 DIAGNOSIS — M25572 Pain in left ankle and joints of left foot: Secondary | ICD-10-CM | POA: Diagnosis not present

## 2023-09-25 DIAGNOSIS — M25572 Pain in left ankle and joints of left foot: Secondary | ICD-10-CM | POA: Diagnosis not present

## 2023-09-30 DIAGNOSIS — M25572 Pain in left ankle and joints of left foot: Secondary | ICD-10-CM | POA: Diagnosis not present

## 2023-10-02 DIAGNOSIS — M25572 Pain in left ankle and joints of left foot: Secondary | ICD-10-CM | POA: Diagnosis not present

## 2023-10-07 DIAGNOSIS — M25572 Pain in left ankle and joints of left foot: Secondary | ICD-10-CM | POA: Diagnosis not present

## 2023-10-10 DIAGNOSIS — M25572 Pain in left ankle and joints of left foot: Secondary | ICD-10-CM | POA: Diagnosis not present

## 2023-10-13 DIAGNOSIS — Z471 Aftercare following joint replacement surgery: Secondary | ICD-10-CM | POA: Diagnosis not present

## 2023-10-13 DIAGNOSIS — Z96662 Presence of left artificial ankle joint: Secondary | ICD-10-CM | POA: Diagnosis not present

## 2023-10-13 DIAGNOSIS — M25572 Pain in left ankle and joints of left foot: Secondary | ICD-10-CM | POA: Diagnosis not present

## 2023-10-15 ENCOUNTER — Encounter: Payer: Self-pay | Admitting: Primary Care

## 2023-10-15 ENCOUNTER — Ambulatory Visit: Admitting: Primary Care

## 2023-10-15 VITALS — BP 126/80 | HR 86 | Temp 97.2°F | Ht 71.0 in | Wt 225.0 lb

## 2023-10-15 DIAGNOSIS — L989 Disorder of the skin and subcutaneous tissue, unspecified: Secondary | ICD-10-CM | POA: Diagnosis not present

## 2023-10-15 DIAGNOSIS — M545 Low back pain, unspecified: Secondary | ICD-10-CM | POA: Insufficient documentation

## 2023-10-15 NOTE — Progress Notes (Signed)
 Subjective:    Patient ID: Austin Nguyen, male    DOB: 27-Feb-1971, 53 y.o.   MRN: [id number]  Back Pain Pertinent negatives include no numbness or weakness.    Austin Nguyen is a very pleasant 53 y.o. male patient of [place], NP without a significant medical history who presents today to discuss several concerns.  1) Skin Spots: He's noticed several skin spots to the face. One to the right temporal region which began about 1 year ago. Over the last 4 months he's noticed an increase in size of the spot. His other facial spot is to the left cheek which began about 1 year ago, slightly larger over the last few months. His wife has noticed a spot to his thoracic back.   He plays golf twice monthly outdoors. He does wear sunscreen. He does not see dermatology.   2) Back Pain: His back pain is located to the bilateral lower back at the SI joints which began 3 weeks ago. He underwent his 3rd ankle surgery to the left ankle in February 2025, because of this he's been more sedentary. He was in a bed for 1 month, then was scooting around on a scooter for months.   He began working again about 3-4 weeks ago, works at [employer]. His pain is mostly noticeable when leaning forward with and without picking up objects at work. He denies radiation of pain down his lower extremities, numbness/tingling, loss of bowel/bladder control.    Over the last week he's noticed improvement in his symptoms. He will take Tylenol  on occasion with improvement. He's much more physically active than he was.    Review of Systems  Musculoskeletal:  Positive for back pain.  Skin:        Skin lesions   Neurological:  Negative for weakness and numbness.         Past Medical History:  Diagnosis Date   Cellulitis 10/17/2014   Right foot      Cholecystitis, acute 09/24/2015   EUSTACHIAN TUBE DYSFUNCTION, LEFT 08/24/2008   Qualifier: Diagnosis of  By: Baird FNP, Frieda Kipper    Gastro-esophageal  reflux disease without esophagitis 11/23/2014   Panic disorder 09/21/2012   Primary osteoarthritis, left ankle and foot 10/26/2014    Social History   Socioeconomic History   Marital status: Married    Spouse name: Not on file   Number of children: Not on file   Years of education: Not on file   Highest education level: Not on file  Occupational History   Not on file  Tobacco Use   Smoking status: Never   Smokeless tobacco: Former  Substance and Sexual Activity   Alcohol use: Yes    Alcohol/week: 0.0 standard drinks of alcohol    Comment: occasional   Drug use: No   Sexual activity: Yes  Other Topics Concern   Not on file  Social History Narrative   Not on file   Social Drivers of Health   Financial Resource Strain: Low Risk  (05/23/2023)   [facility]   Overall Financial Resource Strain (CARDIA)    Difficulty of Paying Living Expenses: Not hard at all  Food Insecurity: No Food Insecurity (05/23/2023)   Received from [facility]   Hunger Vital Sign    Within the past 12 months, you worried that your food would run out before you got the money to buy more.: Never true    Within the  past 12 months, the food you bought just didn't last and you didn't have money to get more.: Never true  Transportation Needs: No Transportation Needs (05/23/2023)   Received from [facility] - Transportation    In the past 12 months, has lack of transportation kept you from medical appointments or from getting medications?: No    Lack of Transportation (Non-Medical): No  Physical Activity: Not on file  Stress: Not on file  Social Connections: Not on file  Intimate Partner Violence: Not on file    Past Surgical History:  Procedure Laterality Date   CHOLECYSTECTOMY N/A 09/25/2015   Procedure: LAPAROSCOPIC CHOLECYSTECTOMY;  Surgeon: Charlie FORBES Fell, MD;  Location: [facility] ORS;  Service: General;  Laterality: N/A;   HERNIA  REPAIR Left [birth year]- 25 years old   Inguinal   JOINT REPLACEMENT Left 11/2014   Ankle    Family History  Problem Relation Age of Onset   Lung cancer Mother    Colon cancer Father    Diabetes Paternal Grandfather     No Known Allergies  No current outpatient medications on file prior to visit.   No current facility-administered medications on file prior to visit.    BP 126/80   Pulse 86   Temp (!) 97.2 F (36.2 C) (Temporal)   Ht 5' 11 (1.803 m)   Wt 225 lb (102.1 kg)   SpO2 97%   BMI 31.38 kg/m  Objective:   Physical Exam Cardiovascular:     Rate and Rhythm: Normal rate.  Pulmonary:     Effort: Pulmonary effort is normal.  Musculoskeletal:     Lumbar back: No tenderness or bony tenderness. Normal range of motion. Negative right straight leg raise test and negative left straight leg raise test.       Back:  Skin:    General: Skin is warm and dry.     Comments: Several red, raised skin lesions to face.  Several brown nevi to posterior trunk.  Neurological:     Mental Status: He is alert.           Assessment & Plan:  Skin lesions Assessment & Plan: Multiple.  Given his fair complexion referral placed to dermatology for further evaluation. We discussed the importance of sunscreen application daily, anytime he is outdoors.  Orders: -     Ambulatory referral to Dermatology  Acute bilateral low back pain without sciatica Assessment & Plan: To the SI joint region.  Fortunately, his symptoms are improving.  We discussed that this was likely secondary to resuming physical activity after his sedentary lifestyle for several months.   He will continue to work on stretching and maintaining physical activity.  Continue Tylenol  as needed. No alarm signs on exam.         [place] MARLA Gaskins, NP

## 2023-10-15 NOTE — Assessment & Plan Note (Signed)
 Multiple.  Given his fair complexion referral placed to dermatology for further evaluation. We discussed the importance of sunscreen application daily, anytime he is outdoors.

## 2023-10-15 NOTE — Assessment & Plan Note (Signed)
 To the SI joint region.  Fortunately, his symptoms are improving.  We discussed that this was likely secondary to resuming physical activity after his sedentary lifestyle for several months.   He will continue to work on stretching and maintaining physical activity.  Continue Tylenol  as needed. No alarm signs on exam.

## 2023-10-15 NOTE — Patient Instructions (Signed)
 You will either be contacted via phone regarding your referral to dermatology, or you may receive a letter on your MyChart portal from our referral team with instructions for scheduling an appointment. Please let us  know if you have not been contacted by anyone within two weeks.  Continue to work on stretching and walking.  It was a pleasure to see you today!

## 2023-10-16 DIAGNOSIS — M25572 Pain in left ankle and joints of left foot: Secondary | ICD-10-CM | POA: Diagnosis not present

## 2023-10-21 DIAGNOSIS — M25572 Pain in left ankle and joints of left foot: Secondary | ICD-10-CM | POA: Diagnosis not present

## 2023-10-23 DIAGNOSIS — M25572 Pain in left ankle and joints of left foot: Secondary | ICD-10-CM | POA: Diagnosis not present

## 2023-10-29 DIAGNOSIS — M25572 Pain in left ankle and joints of left foot: Secondary | ICD-10-CM | POA: Diagnosis not present

## 2023-11-04 DIAGNOSIS — M25572 Pain in left ankle and joints of left foot: Secondary | ICD-10-CM | POA: Diagnosis not present

## 2023-11-06 DIAGNOSIS — M25572 Pain in left ankle and joints of left foot: Secondary | ICD-10-CM | POA: Diagnosis not present

## 2023-11-18 DIAGNOSIS — M25572 Pain in left ankle and joints of left foot: Secondary | ICD-10-CM | POA: Diagnosis not present

## 2023-11-20 DIAGNOSIS — M25572 Pain in left ankle and joints of left foot: Secondary | ICD-10-CM | POA: Diagnosis not present

## 2023-12-15 DIAGNOSIS — T84213A Breakdown (mechanical) of internal fixation device of bones of foot and toes, initial encounter: Secondary | ICD-10-CM | POA: Diagnosis not present

## 2023-12-15 DIAGNOSIS — Z96662 Presence of left artificial ankle joint: Secondary | ICD-10-CM | POA: Diagnosis not present

## 2023-12-24 DIAGNOSIS — L538 Other specified erythematous conditions: Secondary | ICD-10-CM | POA: Diagnosis not present

## 2023-12-24 DIAGNOSIS — L570 Actinic keratosis: Secondary | ICD-10-CM | POA: Diagnosis not present

## 2023-12-24 DIAGNOSIS — R208 Other disturbances of skin sensation: Secondary | ICD-10-CM | POA: Diagnosis not present

## 2023-12-24 DIAGNOSIS — D225 Melanocytic nevi of trunk: Secondary | ICD-10-CM | POA: Diagnosis not present

## 2023-12-24 DIAGNOSIS — D2262 Melanocytic nevi of left upper limb, including shoulder: Secondary | ICD-10-CM | POA: Diagnosis not present

## 2023-12-24 DIAGNOSIS — D2261 Melanocytic nevi of right upper limb, including shoulder: Secondary | ICD-10-CM | POA: Diagnosis not present

## 2023-12-24 DIAGNOSIS — L820 Inflamed seborrheic keratosis: Secondary | ICD-10-CM | POA: Diagnosis not present

## 2024-03-24 DIAGNOSIS — M19172 Post-traumatic osteoarthritis, left ankle and foot: Secondary | ICD-10-CM | POA: Diagnosis not present

## 2024-03-24 DIAGNOSIS — M216X2 Other acquired deformities of left foot: Secondary | ICD-10-CM | POA: Diagnosis not present

## 2024-03-24 DIAGNOSIS — Z96662 Presence of left artificial ankle joint: Secondary | ICD-10-CM | POA: Diagnosis not present

## 2024-03-24 DIAGNOSIS — M19072 Primary osteoarthritis, left ankle and foot: Secondary | ICD-10-CM | POA: Diagnosis not present

## 2024-04-19 ENCOUNTER — Encounter: Payer: BC Managed Care – PPO | Admitting: General Practice

## 2024-04-27 ENCOUNTER — Encounter: Admitting: General Practice

## 2024-05-04 ENCOUNTER — Encounter: Admitting: General Practice

## 2024-05-12 ENCOUNTER — Encounter: Admitting: General Practice
